# Patient Record
Sex: Female | Born: 1940 | Race: White | Hispanic: No | Marital: Married | Smoking: Former smoker
Health system: Southern US, Community
[De-identification: ages and names within clinical notes are randomized; demographics above are authoritative.]

## PROBLEM LIST (undated history)

## (undated) DIAGNOSIS — E039 Hypothyroidism, unspecified: Secondary | ICD-10-CM

## (undated) DIAGNOSIS — K2270 Barrett's esophagus without dysplasia: Secondary | ICD-10-CM

## (undated) DIAGNOSIS — I499 Cardiac arrhythmia, unspecified: Secondary | ICD-10-CM

## (undated) DIAGNOSIS — T8859XA Other complications of anesthesia, initial encounter: Secondary | ICD-10-CM

## (undated) DIAGNOSIS — I10 Essential (primary) hypertension: Secondary | ICD-10-CM

## (undated) DIAGNOSIS — R112 Nausea with vomiting, unspecified: Secondary | ICD-10-CM

## (undated) DIAGNOSIS — K219 Gastro-esophageal reflux disease without esophagitis: Secondary | ICD-10-CM

## (undated) DIAGNOSIS — C801 Malignant (primary) neoplasm, unspecified: Secondary | ICD-10-CM

## (undated) DIAGNOSIS — Z9889 Other specified postprocedural states: Secondary | ICD-10-CM

## (undated) DIAGNOSIS — F419 Anxiety disorder, unspecified: Secondary | ICD-10-CM

## (undated) DIAGNOSIS — R42 Dizziness and giddiness: Secondary | ICD-10-CM

## (undated) DIAGNOSIS — I493 Ventricular premature depolarization: Secondary | ICD-10-CM

## (undated) DIAGNOSIS — T4145XA Adverse effect of unspecified anesthetic, initial encounter: Secondary | ICD-10-CM

## (undated) HISTORY — PX: EYE SURGERY: SHX253

## (undated) HISTORY — PX: TONSILLECTOMY: SUR1361

## (undated) HISTORY — PX: MOUTH SURGERY: SHX715

## (undated) HISTORY — DX: Ventricular premature depolarization: I49.3

## (undated) HISTORY — PX: CHOLECYSTECTOMY: SHX55

---

## 2015-06-15 DIAGNOSIS — H9190 Unspecified hearing loss, unspecified ear: Secondary | ICD-10-CM

## 2015-06-15 DIAGNOSIS — Z79899 Other long term (current) drug therapy: Secondary | ICD-10-CM | POA: Insufficient documentation

## 2015-06-15 DIAGNOSIS — I10 Essential (primary) hypertension: Secondary | ICD-10-CM

## 2015-06-15 DIAGNOSIS — E782 Mixed hyperlipidemia: Secondary | ICD-10-CM

## 2015-06-15 DIAGNOSIS — K219 Gastro-esophageal reflux disease without esophagitis: Secondary | ICD-10-CM

## 2015-06-15 DIAGNOSIS — N183 Chronic kidney disease, stage 3 unspecified: Secondary | ICD-10-CM

## 2015-06-15 DIAGNOSIS — E559 Vitamin D deficiency, unspecified: Secondary | ICD-10-CM

## 2015-06-15 DIAGNOSIS — I493 Ventricular premature depolarization: Secondary | ICD-10-CM | POA: Insufficient documentation

## 2015-06-15 DIAGNOSIS — E785 Hyperlipidemia, unspecified: Secondary | ICD-10-CM | POA: Insufficient documentation

## 2015-06-15 DIAGNOSIS — R7303 Prediabetes: Secondary | ICD-10-CM

## 2015-06-15 DIAGNOSIS — M8580 Other specified disorders of bone density and structure, unspecified site: Secondary | ICD-10-CM

## 2015-06-15 DIAGNOSIS — N1831 Chronic kidney disease, stage 3a: Secondary | ICD-10-CM

## 2015-06-15 HISTORY — DX: Mixed hyperlipidemia: E78.2

## 2015-06-15 HISTORY — DX: Gastro-esophageal reflux disease without esophagitis: K21.9

## 2015-06-15 HISTORY — DX: Prediabetes: R73.03

## 2015-06-15 HISTORY — DX: Vitamin D deficiency, unspecified: E55.9

## 2015-06-15 HISTORY — DX: Essential (primary) hypertension: I10

## 2015-06-15 HISTORY — DX: Unspecified hearing loss, unspecified ear: H91.90

## 2015-06-15 HISTORY — DX: Chronic kidney disease, stage 3a: N18.31

## 2015-06-15 HISTORY — DX: Hyperlipidemia, unspecified: E78.5

## 2015-06-15 HISTORY — DX: Chronic kidney disease, stage 3 unspecified: N18.30

## 2015-06-15 HISTORY — DX: Other specified disorders of bone density and structure, unspecified site: M85.80

## 2015-06-15 HISTORY — DX: Other long term (current) drug therapy: Z79.899

## 2015-07-14 DIAGNOSIS — F411 Generalized anxiety disorder: Secondary | ICD-10-CM | POA: Insufficient documentation

## 2015-07-14 DIAGNOSIS — E039 Hypothyroidism, unspecified: Secondary | ICD-10-CM | POA: Insufficient documentation

## 2015-07-14 DIAGNOSIS — F419 Anxiety disorder, unspecified: Secondary | ICD-10-CM

## 2015-07-14 DIAGNOSIS — E669 Obesity, unspecified: Secondary | ICD-10-CM

## 2015-07-14 HISTORY — DX: Hypothyroidism, unspecified: E03.9

## 2015-07-14 HISTORY — DX: Obesity, unspecified: E66.9

## 2015-07-14 HISTORY — DX: Anxiety disorder, unspecified: F41.9

## 2016-12-05 ENCOUNTER — Encounter: Payer: Self-pay | Admitting: Cardiology

## 2016-12-05 ENCOUNTER — Ambulatory Visit (INDEPENDENT_AMBULATORY_CARE_PROVIDER_SITE_OTHER): Payer: Medicare Other | Admitting: Cardiology

## 2016-12-05 VITALS — BP 144/82 | HR 69 | Ht 64.0 in | Wt 169.0 lb

## 2016-12-05 DIAGNOSIS — R011 Cardiac murmur, unspecified: Secondary | ICD-10-CM | POA: Diagnosis not present

## 2016-12-05 DIAGNOSIS — E782 Mixed hyperlipidemia: Secondary | ICD-10-CM

## 2016-12-05 DIAGNOSIS — I493 Ventricular premature depolarization: Secondary | ICD-10-CM

## 2016-12-05 DIAGNOSIS — I10 Essential (primary) hypertension: Secondary | ICD-10-CM

## 2016-12-05 DIAGNOSIS — R079 Chest pain, unspecified: Secondary | ICD-10-CM | POA: Diagnosis not present

## 2016-12-05 DIAGNOSIS — E669 Obesity, unspecified: Secondary | ICD-10-CM | POA: Diagnosis not present

## 2016-12-05 DIAGNOSIS — R002 Palpitations: Secondary | ICD-10-CM | POA: Diagnosis not present

## 2016-12-05 NOTE — Progress Notes (Signed)
Cardiology Office Note:    Date:  12/05/2016   ID:  Claire Carlson, DOB [birth year], MRN [id number]  PCP:  Raina Mina., MD  Cardiologist:  Jenean Lindau, MD   Referring MD: Raina Mina., MD    ASSESSMENT:    1. Palpitations   2. Essential hypertension   3. PVC (premature ventricular contraction)   4. Mixed hyperlipidemia   5. Obesity (BMI 30-39.9)   6. Chest pain, unspecified type    PLAN:    In order of problems listed above:  1. I reassured the patient about my findings. Her chest pain is atypical for coronary etiology however in view of multiple risk factors she will undergo exercise stress Cardiolite testing. 2. Echocardiogram will be done to assess murmur heard on auscultation. 3. She'll undergo Holter monitoring for 48-hour period. Further recommendations will be made as to the findings of the aforementioned test. 4. She knows to go to the nearest emergency room for any significant concerns. She will be seen in follow-up appointment in 3 months or earlier if she has any concerns.   Medication Adjustments/Labs and Tests Ordered: Current medicines are reviewed at length with the patient today.  Concerns regarding medicines are outlined above.  Orders Placed This Encounter  Procedures  . Holter monitor - 48 hour  . Myocardial Perfusion Imaging  . ECHOCARDIOGRAM COMPLETE   No orders of the defined types were placed in this encounter.    History of Present Illness:    Claire Carlson is a 76 y.o. female who is being seen today for the evaluation of palpitations and chest pain at the request of Raina Mina., MD. Patient is a pleasant 76 year old female. She has past history of essential hypertension and dyslipidemia. Patient mentions that she's been having palpitations for the past several years but in the past few weeks these have increased.They occur very sporadically and have concerned her. She denies any chest pain orthopnea or PND. Occasionally she  feels tightness in the chest not related to exertion. At the time of my evaluation she is alert awake oriented and in no distress.She tells me that she does not regular with exercise but she is worked up to 3 miles on a regular basis in the recent past.  Past Medical History:  Diagnosis Date  . PVC (premature ventricular contraction)     History reviewed. No pertinent surgical history.  Current Medications: Current Meds  Medication Sig  . ALPRAZolam (XANAX) 0.5 MG tablet Take 0.5 mg by mouth daily as needed.  Marland Kitchen atenolol (TENORMIN) 25 MG tablet Take 12.5 mg by mouth daily.  . Cholecalciferol (VITAMIN D-1000 MAX ST) 1000 units tablet Take 1,000 Units by mouth daily.  . fluticasone (FLONASE) 50 MCG/ACT nasal spray Place 1 spray into both nostrils daily.  Marland Kitchen levothyroxine (SYNTHROID, LEVOTHROID) 88 MCG tablet Take 88 mcg by mouth daily.  Marland Kitchen losartan-hydrochlorothiazide (HYZAAR) 100-12.5 MG tablet Take 1 tablet by mouth daily.  . Meclizine HCl 25 MG CHEW Chew 25 mg by mouth daily as needed.  Marland Kitchen omeprazole (PRILOSEC) 20 MG capsule Take 20 mg by mouth daily.  . zoledronic acid (RECLAST) 5 MG/100ML SOLN injection Inject 5 mg into the vein.     Allergies:   Penicillin g   Social History   Social History  . Marital status: Married    Spouse name: N/A  . Number of children: N/A  . Years of education: N/A   Social History Main Topics  . Smoking status: Former Smoker  Quit date: 69  . Smokeless tobacco: Never Used  . Alcohol use No  . Drug use: No  . Sexual activity: Not Asked   Other Topics Concern  . None   Social History Narrative  . None     Family History: The patient's family history includes Heart attack in her father.  ROS:   Please see the history of present illness.    All other systems reviewed and are negative.  EKGs/Labs/Other Studies Reviewed:    The following studies were reviewed today: I reviewed records from referring physician and also the EKG and  discussed this with the patient at length. The EKG report was available.   Recent Labs: No results found for requested labs within last 8760 hours.  Recent Lipid Panel No results found for: CHOL, TRIG, HDL, CHOLHDL, VLDL, LDLCALC, LDLDIRECT  Physical Exam:    VS:  BP (!) 144/82   Pulse 69   Ht 5\' 4"  (1.626 m)   Wt 169 lb (76.7 kg)   SpO2 98%   BMI 29.01 kg/m     Wt Readings from Last 3 Encounters:  12/05/16 169 lb (76.7 kg)     GEN: Patient is in no acute distress HEENT: Normal NECK: No JVD; No carotid bruits LYMPHATICS: No lymphadenopathy CARDIAC: S1 S2 regular, 2/6 systolic murmur at the apex. RESPIRATORY:  Clear to auscultation without rales, wheezing or rhonchi  ABDOMEN: Soft, non-tender, non-distended MUSCULOSKELETAL:  No edema; No deformity  SKIN: Warm and dry NEUROLOGIC:  Alert and oriented x 3 PSYCHIATRIC:  Normal affect    Signed, Jenean Lindau, MD  12/05/2016 12:00 PM    [place]

## 2016-12-05 NOTE — Patient Instructions (Signed)
Medication Instructions:   Your physician recommends that you continue on your current medications as directed. Please refer to the Current Medication list given to you today.  Labwork:  NONE  Testing/Procedures:  Your physician has recommended that you wear a holter monitor. Holter monitors are medical devices that record the heart's electrical activity. Doctors most often use these monitors to diagnose arrhythmias. Arrhythmias are problems with the speed or rhythm of the heartbeat. The monitor is a small, portable device. You can wear one while you do your normal daily activities. This is usually used to diagnose what is causing palpitations/syncope (passing out).  Your physician has requested that you have an echocardiogram. Echocardiography is a painless test that uses sound waves to create images of your heart. It provides your doctor with information about the size and shape of your heart and how well your heart's chambers and valves are working. This procedure takes approximately one hour. There are no restrictions for this procedure.  Your physician has requested that you have en exercise stress myoview. For further information please visit [email or web address]. Please follow instruction sheet, as given.  These test will be done at main office [address] [place], [place], [place] [address].   Follow-Up:  Your physician recommends that you schedule a follow-up appointment in: 3 months with Dr. Geraldo Pitter.    Any Other Special Instructions Will Be Listed Below (If Applicable).     If you need a refill on your cardiac medications before your next appointment, please call your pharmacy.

## 2016-12-11 ENCOUNTER — Telehealth (HOSPITAL_COMMUNITY): Payer: Self-pay | Admitting: *Deleted

## 2016-12-11 ENCOUNTER — Ambulatory Visit: Payer: Medicare Other

## 2016-12-11 DIAGNOSIS — R002 Palpitations: Secondary | ICD-10-CM

## 2016-12-11 NOTE — Telephone Encounter (Signed)
Left message on voicemail per DPR in reference to upcoming appointment scheduled on 12/14/16 with detailed instructions given per Myocardial Perfusion Study Information Sheet for the test. LM to arrive 15 minutes early, and that it is imperative to arrive on time for appointment to keep from having the test rescheduled. If you need to cancel or reschedule your appointment, please call the office within 24 hours of your appointment. Failure to do so may result in a cancellation of your appointment, and a $50 no show fee. Phone number given for call back for any questions. Kirstie Peri

## 2016-12-14 ENCOUNTER — Ambulatory Visit (HOSPITAL_BASED_OUTPATIENT_CLINIC_OR_DEPARTMENT_OTHER): Payer: Medicare Other

## 2016-12-14 ENCOUNTER — Ambulatory Visit (HOSPITAL_COMMUNITY): Payer: Medicare Other | Attending: Cardiology

## 2016-12-14 ENCOUNTER — Other Ambulatory Visit: Payer: Self-pay

## 2016-12-14 DIAGNOSIS — I119 Hypertensive heart disease without heart failure: Secondary | ICD-10-CM | POA: Diagnosis not present

## 2016-12-14 DIAGNOSIS — R002 Palpitations: Secondary | ICD-10-CM | POA: Diagnosis not present

## 2016-12-14 DIAGNOSIS — I272 Pulmonary hypertension, unspecified: Secondary | ICD-10-CM | POA: Insufficient documentation

## 2016-12-14 LAB — MYOCARDIAL PERFUSION IMAGING
CHL CUP MPHR: 143 {beats}/min
CHL CUP NUCLEAR SSS: 9
CHL CUP RESTING HR STRESS: 68 {beats}/min
CHL RATE OF PERCEIVED EXERTION: 18
CSEPEW: 6.9 METS
Exercise duration (min): 5 min
LHR: 0.28
LV sys vol: 13 mL
LVDIAVOL: 58 mL (ref 46–106)
NUC STRESS TID: 1.03
Peak HR: 144 {beats}/min
Percent HR: 100 %
SDS: 4
SRS: 5

## 2016-12-14 IMAGING — NM NM MISC PROCEDURE
9 series · 54 of 54 positions shown · non-contrast
Comparison: none

[Series 1: wbr_r-proj_st rest · 6.51mm/px · 6 of 64 frames shown]
[frame 6/64]
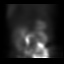
[frame 16/64]
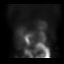
[frame 27/64]
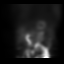
[frame 38/64]
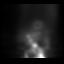
[frame 48/64]
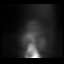
[frame 59/64]
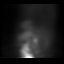

[Series 1: wbr_s-proj_st stress_(id)_sa · 6.5mm · 6.51mm/px · 6 of 64 frames shown (1 of 2)]
[frame 6/64]
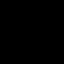
[frame 16/64]
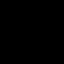
[frame 27/64]
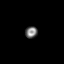
[frame 38/64]
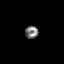
[frame 48/64]
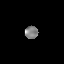
[frame 59/64]
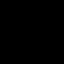

[Series 1: wbr_r-proj_st rest_(id)_sa · 6.5mm · 6.51mm/px · 6 of 64 frames shown]
[frame 6/64]
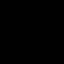
[frame 16/64]
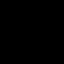
[frame 27/64]
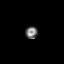
[frame 38/64]
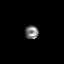
[frame 48/64]
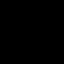
[frame 59/64]
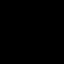

[Series 1: wbr_s-proj_st stress_(id)_sa · 6.5mm · 6.51mm/px · 6 of 512 frames shown (2 of 2)]
[frame 43/512]
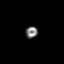
[frame 128/512]
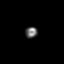
[frame 214/512]
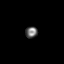
[frame 299/512]
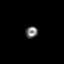
[frame 384/512]
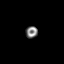
[frame 470/512]
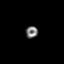

[Series 1: rest · 6.51mm/px · 6 of 64 frames shown]
[frame 6/64]
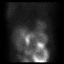
[frame 16/64]
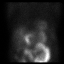
[frame 27/64]
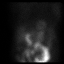
[frame 38/64]
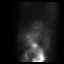
[frame 48/64]
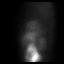
[frame 59/64]
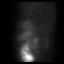

[Series 2: wbr_s-proj_st stress · 6.51mm/px · 6 of 512 frames shown (1 of 2)]
[frame 43/512]
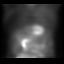
[frame 128/512]
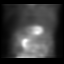
[frame 214/512]
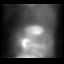
[frame 299/512]
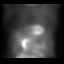
[frame 384/512]
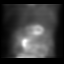
[frame 470/512]
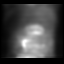

[Series 2: stress · 6.51mm/px · 6 of 64 frames shown (1 of 2)]
[frame 6/64]
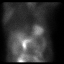
[frame 16/64]
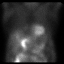
[frame 27/64]
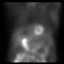
[frame 38/64]
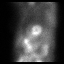
[frame 48/64]
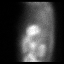
[frame 59/64]
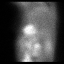

[Series 2: stress · 6.51mm/px · 6 of 485 frames shown (2 of 2)]
[frame 41/485  full-range]
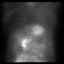
[frame 121/485  full-range]
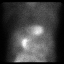
[frame 202/485  full-range]
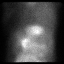
[frame 283/485  full-range]
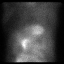
[frame 364/485  full-range]
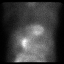
[frame 445/485  full-range]
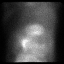

[Series 2: wbr_s-proj_st stress · 6.51mm/px · 6 of 64 frames shown (2 of 2)]
[frame 6/64]
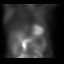
[frame 16/64]
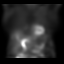
[frame 27/64]
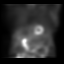
[frame 38/64]
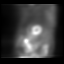
[frame 48/64]
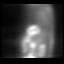
[frame 59/64]
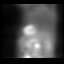

[54 of 54 positions shown; findings below may reference images not displayed]

Canned report from images found in remote index.

Refer to host system for actual result text.

## 2016-12-14 MED ORDER — TECHNETIUM TC 99M TETROFOSMIN IV KIT
10.4000 | PACK | Freq: Once | INTRAVENOUS | Status: AC | PRN
  Administered 2016-12-14: 10.4 via INTRAVENOUS
  Filled 2016-12-14: qty 11

## 2016-12-14 MED ORDER — TECHNETIUM TC 99M TETROFOSMIN IV KIT
32.4000 | PACK | Freq: Once | INTRAVENOUS | Status: AC | PRN
  Administered 2016-12-14: 32.4 via INTRAVENOUS
  Filled 2016-12-14: qty 33

## 2016-12-21 ENCOUNTER — Telehealth: Payer: Self-pay

## 2016-12-21 NOTE — Telephone Encounter (Signed)
Pt informed of normal Holter Monitor via [employer]. Ok per [employer]

## 2017-02-07 DIAGNOSIS — Z171 Estrogen receptor negative status [ER-]: Secondary | ICD-10-CM

## 2017-02-07 DIAGNOSIS — N63 Unspecified lump in unspecified breast: Secondary | ICD-10-CM

## 2017-02-07 DIAGNOSIS — C50112 Malignant neoplasm of central portion of left female breast: Secondary | ICD-10-CM | POA: Insufficient documentation

## 2017-02-07 HISTORY — DX: Malignant neoplasm of central portion of left female breast: C50.112

## 2017-02-07 HISTORY — DX: Unspecified lump in unspecified breast: N63.0

## 2017-02-07 HISTORY — DX: Estrogen receptor negative status (ER-): Z17.1

## 2017-02-24 DIAGNOSIS — C801 Malignant (primary) neoplasm, unspecified: Secondary | ICD-10-CM | POA: Insufficient documentation

## 2017-02-24 HISTORY — DX: Malignant (primary) neoplasm, unspecified: C80.1

## 2017-02-26 ENCOUNTER — Other Ambulatory Visit: Payer: Self-pay | Admitting: Internal Medicine

## 2017-02-26 DIAGNOSIS — N6489 Other specified disorders of breast: Secondary | ICD-10-CM

## 2017-03-06 ENCOUNTER — Ambulatory Visit: Payer: Medicare Other | Admitting: Cardiology

## 2017-03-09 ENCOUNTER — Ambulatory Visit
Admission: RE | Admit: 2017-03-09 | Discharge: 2017-03-09 | Disposition: A | Discharge status: Home / Self Care | Payer: 59 | Source: Ambulatory Visit | Attending: Internal Medicine | Admitting: Internal Medicine

## 2017-03-09 DIAGNOSIS — N6489 Other specified disorders of breast: Secondary | ICD-10-CM

## 2017-03-14 ENCOUNTER — Ambulatory Visit (INDEPENDENT_AMBULATORY_CARE_PROVIDER_SITE_OTHER): Payer: Medicare Other | Admitting: Cardiology

## 2017-03-14 ENCOUNTER — Encounter: Payer: Self-pay | Admitting: Cardiology

## 2017-03-14 VITALS — BP 134/74 | HR 56 | Ht 65.0 in | Wt 170.4 lb

## 2017-03-14 DIAGNOSIS — E669 Obesity, unspecified: Secondary | ICD-10-CM | POA: Diagnosis not present

## 2017-03-14 DIAGNOSIS — I493 Ventricular premature depolarization: Secondary | ICD-10-CM | POA: Diagnosis not present

## 2017-03-14 DIAGNOSIS — I10 Essential (primary) hypertension: Secondary | ICD-10-CM | POA: Diagnosis not present

## 2017-03-14 DIAGNOSIS — E782 Mixed hyperlipidemia: Secondary | ICD-10-CM

## 2017-03-14 DIAGNOSIS — I4729 Other ventricular tachycardia: Secondary | ICD-10-CM

## 2017-03-14 DIAGNOSIS — I472 Ventricular tachycardia: Secondary | ICD-10-CM | POA: Insufficient documentation

## 2017-03-14 HISTORY — DX: Other ventricular tachycardia: I47.29

## 2017-03-14 HISTORY — DX: Ventricular tachycardia: I47.2

## 2017-03-14 NOTE — Patient Instructions (Signed)
Medication Instructions:  Your physician recommends that you continue on your current medications as directed. Please refer to the Current Medication list given to you today.  Labwork: None  Testing/Procedures: None  Follow-Up: Your physician recommends that you schedule a follow-up appointment in: 6 months  Any Other Special Instructions Will Be Listed Below (If Applicable).     If you need a refill on your cardiac medications before your next appointment, please call your pharmacy.   [facility]  Aizlyn Schifano A, RN, BSN  

## 2017-03-14 NOTE — Progress Notes (Signed)
Cardiology Office Note:    Date:  03/14/2017   ID:  Claire Carlson, DOB 1940/09/03, MRN [id number]  PCP:  Raina Mina., MD  Cardiologist:  Jenean Lindau, MD   Referring MD: Raina Mina., MD    ASSESSMENT:    No diagnosis found. PLAN:    In order of problems listed above:  1. I discussed stress test report with the patient.  Her stress test, echocardiogram and Holter monitor reports are unremarkable.  She has had one ventricular triplet.  Her left ventricular systolic function is fine.  Patient is asymptomatic and her hemodynamics are optimally controlled.  In view of this she has not at high risk for coronary events during the aforementioned surgery.  Meticulous hemodynamic monitoring will further reduce the risk of coronary events. 2. Blood pressure is stable and lipids are followed by primary care physician. 3. Patient will be seen in follow-up appointment in 6 months or earlier if the patient has any concerns    Medication Adjustments/Labs and Tests Ordered: Current medicines are reviewed at length with the patient today.  Concerns regarding medicines are outlined above.  No orders of the defined types were placed in this encounter.  No orders of the defined types were placed in this encounter.    Chief Complaint  Patient presents with  . Follow-up     History of Present Illness:    Claire Carlson is a 76 y.o. female.  Patient has past medical history of essential hypertension and dyslipidemia.  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  She is due to see a Psychologist, sport and exercise in the next few days.  She denies any problems at this time and takes care of activities of daily living.  No chest pain orthopnea or PND.  At the time of my evaluation, the patient is alert awake oriented and in no distress.  She is an active lady and takes care of activities of daily living with no symptoms.  No chest pain.  She  denies any shortness of breath on exertion.  She is undergone multiple testing for cardiovascular evaluation from operative standpoint.  Past Medical History:  Diagnosis Date  . PVC (premature ventricular contraction)     No past surgical history on file.  Current Medications: Current Meds  Medication Sig  . ALPRAZolam (XANAX) 0.5 MG tablet Take 0.5 mg by mouth daily as needed.  Marland Kitchen atenolol (TENORMIN) 25 MG tablet Take 12.5 mg by mouth daily.  . Cholecalciferol (VITAMIN D-1000 MAX ST) 1000 units tablet Take 1,000 Units by mouth daily.  . fluticasone (FLONASE) 50 MCG/ACT nasal spray Place 1 spray into both nostrils daily.  Marland Kitchen levothyroxine (SYNTHROID, LEVOTHROID) 88 MCG tablet Take 88 mcg by mouth daily.  Marland Kitchen losartan-hydrochlorothiazide (HYZAAR) 100-12.5 MG tablet Take 1 tablet by mouth daily.  . Meclizine HCl 25 MG CHEW Chew 25 mg by mouth daily as needed.  Marland Kitchen omeprazole (PRILOSEC) 20 MG capsule Take 20 mg by mouth daily.  . zoledronic acid (RECLAST) 5 MG/100ML SOLN injection Inject 5 mg into the vein.     Allergies:   Penicillin g   Social History   Socioeconomic History  . Marital status: Married    Spouse name: None  . Number of children: None  . Years of education: None  . Highest education level: None  Social Needs  . Financial resource strain: None  . Food insecurity - worry: None  .  Food insecurity - inability: None  . Transportation needs - medical: None  . Transportation needs - non-medical: None  Occupational History  . None  Tobacco Use  . Smoking status: Former Smoker    Last attempt to quit: 1985    Years since quitting: 33.9  . Smokeless tobacco: Never Used  Substance and Sexual Activity  . Alcohol use: No  . Drug use: No  . Sexual activity: None  Other Topics Concern  . None  Social History Narrative  . None     Family History: The patient's family history includes Heart attack in her father.  ROS:   Please see the history of present illness.      All other systems reviewed and are negative.  EKGs/Labs/Other Studies Reviewed:    The following studies were reviewed today: I discussed after reviewing Holter monitor, stress test and echocardiogram with the patient at extensive length and questions were answered to her satisfaction.   Recent Labs: No results found for requested labs within last 8760 hours.  Recent Lipid Panel No results found for: CHOL, TRIG, HDL, CHOLHDL, VLDL, LDLCALC, LDLDIRECT  Physical Exam:    VS:  BP 134/74   Pulse (!) 56   Ht 5\' 5"  (1.651 m)   Wt 170 lb 6.4 oz (77.3 kg)   SpO2 99%   BMI 28.36 kg/m     Wt Readings from Last 3 Encounters:  03/14/17 170 lb 6.4 oz (77.3 kg)  12/14/16 169 lb (76.7 kg)  12/05/16 169 lb (76.7 kg)     GEN: Patient is in no acute distress HEENT: Normal NECK: No JVD; No carotid bruits LYMPHATICS: No lymphadenopathy CARDIAC: Hear sounds regular, 2/6 systolic murmur at the apex. RESPIRATORY:  Clear to auscultation without rales, wheezing or rhonchi  ABDOMEN: Soft, non-tender, non-distended MUSCULOSKELETAL:  No edema; No deformity  SKIN: Warm and dry NEUROLOGIC:  Alert and oriented x 3 PSYCHIATRIC:  Normal affect   Signed, Jenean Lindau, MD  03/14/2017 10:56 AM    [place]

## 2017-03-22 ENCOUNTER — Other Ambulatory Visit: Payer: Self-pay | Admitting: General Surgery

## 2017-03-22 ENCOUNTER — Ambulatory Visit: Payer: Self-pay | Admitting: General Surgery

## 2017-03-22 DIAGNOSIS — C50911 Malignant neoplasm of unspecified site of right female breast: Secondary | ICD-10-CM

## 2017-03-23 ENCOUNTER — Other Ambulatory Visit: Payer: Self-pay | Admitting: General Surgery

## 2017-03-23 DIAGNOSIS — C50911 Malignant neoplasm of unspecified site of right female breast: Secondary | ICD-10-CM

## 2017-03-30 ENCOUNTER — Encounter (HOSPITAL_BASED_OUTPATIENT_CLINIC_OR_DEPARTMENT_OTHER): Payer: Self-pay | Admitting: *Deleted

## 2017-03-30 ENCOUNTER — Other Ambulatory Visit: Payer: Self-pay

## 2017-04-02 ENCOUNTER — Encounter (HOSPITAL_BASED_OUTPATIENT_CLINIC_OR_DEPARTMENT_OTHER)
Admission: RE | Admit: 2017-04-02 | Discharge: 2017-04-02 | Disposition: A | Discharge status: Home / Self Care | Payer: Medicare Other | Source: Ambulatory Visit | Attending: General Surgery | Admitting: General Surgery

## 2017-04-02 DIAGNOSIS — I10 Essential (primary) hypertension: Secondary | ICD-10-CM | POA: Insufficient documentation

## 2017-04-02 DIAGNOSIS — Z01810 Encounter for preprocedural cardiovascular examination: Secondary | ICD-10-CM | POA: Diagnosis present

## 2017-04-02 DIAGNOSIS — I491 Atrial premature depolarization: Secondary | ICD-10-CM | POA: Insufficient documentation

## 2017-04-02 DIAGNOSIS — I493 Ventricular premature depolarization: Secondary | ICD-10-CM | POA: Insufficient documentation

## 2017-04-02 DIAGNOSIS — Z01812 Encounter for preprocedural laboratory examination: Secondary | ICD-10-CM | POA: Insufficient documentation

## 2017-04-02 LAB — BASIC METABOLIC PANEL
Anion gap: 7 (ref 5–15)
BUN: 17 mg/dL (ref 6–20)
CHLORIDE: 100 mmol/L — AB (ref 101–111)
CO2: 28 mmol/L (ref 22–32)
Calcium: 9.7 mg/dL (ref 8.9–10.3)
Creatinine, Ser: 1.15 mg/dL — ABNORMAL HIGH (ref 0.44–1.00)
GFR calc non Af Amer: 45 mL/min — ABNORMAL LOW (ref >60)
GFR, EST AFRICAN AMERICAN: 52 mL/min — AB (ref >60)
GLUCOSE: 89 mg/dL (ref 65–99)
POTASSIUM: 5.1 mmol/L (ref 3.5–5.1)
Sodium: 135 mmol/L (ref 135–145)

## 2017-04-02 NOTE — Progress Notes (Addendum)
Ensure pre surgery drink given with instructions to complete by [place], surgical scrub given with instructions, pt verbalized understanding.  EKG reviewed by Dr. Eligha Bridegroom, will proceed with surgery as scheduled.

## 2017-04-05 ENCOUNTER — Ambulatory Visit
Admission: RE | Admit: 2017-04-05 | Discharge: 2017-04-05 | Disposition: A | Discharge status: Home / Self Care | Payer: Medicare Other | Source: Ambulatory Visit | Attending: General Surgery | Admitting: General Surgery

## 2017-04-05 DIAGNOSIS — C50911 Malignant neoplasm of unspecified site of right female breast: Secondary | ICD-10-CM

## 2017-04-05 NOTE — Anesthesia Preprocedure Evaluation (Addendum)
Anesthesia Evaluation    History of Anesthesia Complications (+) PONV and history of anesthetic complications  Airway Mallampati: II  TM Distance: >3 FB Neck ROM: Full    Dental no notable dental hx.    Pulmonary former smoker,    Pulmonary exam normal breath sounds clear to auscultation       Cardiovascular hypertension, Pt. on medications and Pt. on home beta blockers Normal cardiovascular exam Rhythm:Regular Rate:Normal     Neuro/Psych    GI/Hepatic GERD  Medicated,  Endo/Other  Hypothyroidism   Renal/GU      Musculoskeletal   Abdominal   Peds  Hematology   Anesthesia Other Findings Stress  9/18           Nuclear stress EF: 77%.  There was no ST segment deviation noted during stress.  Blood pressure demonstrated a hypertensive response to exercise.  The study is normal.  This is a low risk study.  The left ventricular ejection fraction is hyperdynamic (>65%).   Normal exercise nuclear stress test with no evidence    Echo 9/18 - Normal LV size with mild LV hypertrophy. EF 60-65%. Normal   diastolic function. Normal RV size and systolic function.   Borderline pulmonary hypertension. No significant valvular   abnormalities.  Reproductive/Obstetrics                            Anesthesia Physical Anesthesia Plan  ASA: II  Anesthesia Plan: General   Post-op Pain Management: GA combined w/ Regional for post-op pain   Induction: Intravenous  PONV Risk Score and Plan: 3 and Ondansetron, Dexamethasone, Treatment may vary due to age or medical condition and Propofol infusion  Airway Management Planned: Oral ETT and LMA  Additional Equipment:   Intra-op Plan:   Post-operative Plan: Extubation in OR  Informed Consent:   Plan Discussed with: CRNA and Anesthesiologist  Anesthesia Plan Comments: (  )        Anesthesia Quick Evaluation

## 2017-04-05 NOTE — H&P (Signed)
History of Present Illness Marland Kitchen T. Deshauna Cayson MD; 03/22/2017 11:04 AM) The patient is a 77 year old female who presents with breast cancer. She is a post menopausal female referred by Dr. 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 She recently presented for a screening mamogram revealing a new area of microcalcifications in the right breast as well as potential architectural distortion in the same quadrant. Subsequent imaging included diagnostic mamogram showing an area of pleomorphic calcifications measuring approximately 1 cm and persistent distortion. Ultrasound there also showed a small adjacent mass. In [place] she underwent stereotactic biopsy of the area of calcifications revealing high-grade ductal carcinoma in situ and ultrasound-guided biopsy of the mass revealing fibrocystic change. Neither of these areas correlated to the area of distortion and she underwent a tomosynthesis guided biopsy in [place] which revealed invasive carcinoma.. She is seen now in the office for initial treatment planning. She has experienced no breast symptoms prior to her recent screening mammogram, specifically lump or pain or nipple discharge. She does not have a personal history of any previous breast problems. I reviewed the imaging personally with radiology. Currently there is an X-shaped clip in the area of distortion and invasive carcinoma upper outer quadrant anterior depth and a ribbon-shaped clip upper outer quadrant middle depth in the area of DCIS. A coil-shaped marker is present in the area fibrocystic change. Overall the 2 areas of malignancy and clip encompass 4 cm.  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 Cataract Surgery  Bilateral. Cesarean Section - 1  Colon Polyp Removal - Colonoscopy  Gallbladder Surgery - Laparoscopic  Oral Surgery  Tonsillectomy   Diagnostic Studies History Alean Rinne, RMA; 03/22/2017 10:00 AM) Colonoscopy  1-5 years ago Mammogram  within last year Pap Smear  1-5 years ago  Allergies Alean Rinne, RMA; 03/22/2017 10:02 AM) Penicillins  Biaxin *MACROLIDES*  Allergies Reconciled   Medication History Alean Rinne, RMA; 03/22/2017 10:03 AM) Atenolol (25MG Tablet, Oral) Active. Losartan Potassium-HCTZ (100-12.5MG Tablet, Oral) Active. Synthroid (88MCG Tablet, Oral) Active. Omeprazole (20MG Capsule DR, Oral) Active. Medications Reconciled  Social History Alean Rinne, [place]; 03/22/2017 10:00 AM) Caffeine use  Tea. No alcohol use  No drug use  Tobacco use  Former smoker.  Family History Alean Rinne, [place]; 03/22/2017 10:00 AM) Cancer  Mother. Heart Disease  Father. Heart disease in female family member before age 16  Hypertension  Mother, Sister. Thyroid problems  Mother, Sister.  Pregnancy / Birth History Alean Rinne, [place]; 03/22/2017 10:00 AM) Age at menarche  57 years. Age of menopause  59-50 Contraceptive History  Oral contraceptives. Gravida  2 Maternal age  35-25 Para  61  Other Problems Alean Rinne, [place]; 03/22/2017 10:00 AM) Cholelithiasis  Gastroesophageal Reflux Disease  Hepatitis  High blood pressure  Thyroid Disease     Review of Systems Alean Rinne RMA; 03/22/2017 10:00 AM) General Not Present- Appetite Loss, Chills, Fatigue, Fever, Night Sweats, Weight Gain and Weight Loss. Skin Not Present- Change in Wart/Mole, Dryness, Hives, Jaundice, New Lesions, Non-Healing Wounds, Rash and Ulcer. HEENT Present- Seasonal Allergies and Wears glasses/contact lenses. Not Present-  Earache, Hearing Loss, Hoarseness, Nose Bleed, Oral Ulcers, Ringing  in the Ears, Sinus Pain, Sore Throat, Visual Disturbances and Yellow Eyes. Respiratory Not Present- Bloody sputum, Chronic Cough, Difficulty Breathing, Snoring and Wheezing. Breast Present- Breast Pain and Skin Changes. Not Present- Breast Mass and Nipple Discharge. Cardiovascular Present- Palpitations. Not Present- Chest Pain, Difficulty Breathing Lying Down, Leg Cramps, Rapid Heart Rate, Shortness of Breath and Swelling of Extremities. Female Genitourinary Not Present- Frequency, Nocturia, Painful Urination, Pelvic Pain and Urgency. Musculoskeletal Not Present- Back Pain, Joint Pain, Joint Stiffness, Muscle Pain, Muscle Weakness and Swelling of Extremities. Neurological Not Present- Decreased Memory, Fainting, Headaches, Numbness, Seizures, Tingling, Tremor, Trouble walking and Weakness. Psychiatric Not Present- Anxiety, Bipolar, Change in Sleep Pattern, Depression, Fearful and Frequent crying. Endocrine Not Present- Cold Intolerance, Excessive Hunger, Hair Changes, Heat Intolerance, Hot flashes and New Diabetes. Hematology Not Present- Blood Thinners, Easy Bruising, Excessive bleeding, Gland problems, HIV and Persistent Infections.  Vitals Mardene Celeste King RMA; 03/22/2017 10:02 AM) 03/22/2017 10:01 AM Weight: 170 lb Height: 65in Body Surface Area: 1.85 m Body Mass Index: 28.29 kg/m  Temp.: 98.79F  Pulse: 98 (Regular)  BP: 150/82 (Sitting, Left Arm, Standard)       Physical Exam Marland Kitchen T. Kenidi Elenbaas MD; 03/22/2017 11:05 AM) The physical exam findings are as follows: Note:General: Alert, well-developed and well nourished older Caucasian female, in no distress Skin: Warm and dry without rash or infection. HEENT: No palpable masses or thyromegaly. Sclera nonicteric. Pupils equal round and reactive. Lymph nodes: No cervical, supraclavicular, nodes palpable. Breasts: There is some thickening or mass in the  upper outer right breast post biopsy. No palpable abnormalities otherwise in either breast. No palpable axillary adenopathy. Lungs: Breath sounds clear and equal. No wheezing or increased work of breathing. Cardiovascular: Regular rate and rhythm without murmer. Abdomen: Nondistended. Soft and nontender. No masses palpable. No organomegaly. No palpable hernias. Extremities: No edema or joint swelling or deformity. No chronic venous stasis changes. Neurologic: Alert and fully oriented. Gait normal. No focal weakness. Psychiatric: Normal mood and affect. Thought content appropriate with normal judgement and insight    Assessment & Plan Marland Kitchen T. Chelsey Kimberley MD; 03/22/2017 11:09 AM) MALIGNANT NEOPLASM OF RIGHT BREAST, STAGE 1, ESTROGEN RECEPTOR POSITIVE (C50.911) Impression: 77 year old female with a new diagnosis of cancer of the right breast, upper outer quadrant. Clinical stage Ia, ER positive, PR negative, HER-2 negative. 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 Both areas encompass a total of 4 cm in greatest diameter measuring about 1 cm each. I discussed with the patient and family members present today initial surgical treatment options. We discussed options of breast conservation with lumpectomy or total mastectomy and sentinal lymph node biopsy/dissection. She would not be an absolutely ideal candidate for breast conservation with 2 separate areas but these are in the same quadrant and nearby and could be encompassed with a single lumpectomy. After discussion they have elected to proceed with this conservation with radioactive seed localized lumpectomy and axillary sentinel lymph node biopsy. We discussed the indications and nature of the procedure, and expected recovery, in detail. Surgical risks including anesthetic complications, cardiorespiratory complications, bleeding, infection, wound healing complications,  blood clots, lymphedema, local and distant recurrence and possible need for further surgery based on the final pathology was discussed and understood. Chemotherapy, hormonal therapy and radiation therapy have been discussed. They have been provided with literature regarding the treatment of breast cancer. All questions were answered. She just saw her cardiologist and we  have verbal clearance and we will confirm this. They understand and agree to proceed and we will go ahead with scheduling. Current Plans Referred to Oncology, for evaluation and follow up (Oncology). Routine. Referred to Radiation Oncology, for evaluation and follow up (Radiation Oncology). Routine. Pt Education - [facility] Breast Cancer Information Given - [employer] "Breast Journey" Package Radioactive seed localized right breast lumpectomy 2 and right axillary sentinel lymph node biopsy under general anesthesia as an outpatient

## 2017-04-06 ENCOUNTER — Ambulatory Visit (HOSPITAL_BASED_OUTPATIENT_CLINIC_OR_DEPARTMENT_OTHER): Payer: Medicare Other | Admitting: Certified Registered"

## 2017-04-06 ENCOUNTER — Encounter (HOSPITAL_BASED_OUTPATIENT_CLINIC_OR_DEPARTMENT_OTHER)
Admission: RE | Disposition: A | Discharge status: Home / Self Care | Payer: Self-pay | Source: Ambulatory Visit | Attending: General Surgery

## 2017-04-06 ENCOUNTER — Ambulatory Visit
Admission: RE | Admit: 2017-04-06 | Discharge: 2017-04-06 | Disposition: A | Discharge status: Home / Self Care | Payer: Medicare Other | Source: Ambulatory Visit | Attending: General Surgery | Admitting: General Surgery

## 2017-04-06 ENCOUNTER — Ambulatory Visit (HOSPITAL_BASED_OUTPATIENT_CLINIC_OR_DEPARTMENT_OTHER)
Admission: RE | Admit: 2017-04-06 | Discharge: 2017-04-06 | Disposition: A | Discharge status: Home / Self Care | Payer: Medicare Other | Source: Ambulatory Visit | Attending: General Surgery | Admitting: General Surgery

## 2017-04-06 ENCOUNTER — Ambulatory Visit (HOSPITAL_COMMUNITY)
Admission: RE | Admit: 2017-04-06 | Discharge: 2017-04-06 | Disposition: A | Discharge status: Home / Self Care | Payer: Medicare Other | Source: Ambulatory Visit | Attending: General Surgery | Admitting: General Surgery

## 2017-04-06 ENCOUNTER — Encounter (HOSPITAL_BASED_OUTPATIENT_CLINIC_OR_DEPARTMENT_OTHER): Payer: Self-pay | Admitting: Certified Registered"

## 2017-04-06 DIAGNOSIS — C50911 Malignant neoplasm of unspecified site of right female breast: Secondary | ICD-10-CM

## 2017-04-06 DIAGNOSIS — Z170 Estrogen receptor positive status [ER+]: Secondary | ICD-10-CM | POA: Insufficient documentation

## 2017-04-06 DIAGNOSIS — Z87891 Personal history of nicotine dependence: Secondary | ICD-10-CM | POA: Insufficient documentation

## 2017-04-06 DIAGNOSIS — K219 Gastro-esophageal reflux disease without esophagitis: Secondary | ICD-10-CM | POA: Diagnosis not present

## 2017-04-06 DIAGNOSIS — Z79899 Other long term (current) drug therapy: Secondary | ICD-10-CM | POA: Insufficient documentation

## 2017-04-06 DIAGNOSIS — E039 Hypothyroidism, unspecified: Secondary | ICD-10-CM | POA: Insufficient documentation

## 2017-04-06 DIAGNOSIS — I10 Essential (primary) hypertension: Secondary | ICD-10-CM | POA: Diagnosis not present

## 2017-04-06 DIAGNOSIS — C50411 Malignant neoplasm of upper-outer quadrant of right female breast: Secondary | ICD-10-CM | POA: Diagnosis present

## 2017-04-06 HISTORY — DX: Other complications of anesthesia, initial encounter: T88.59XA

## 2017-04-06 HISTORY — DX: Barrett's esophagus without dysplasia: K22.70

## 2017-04-06 HISTORY — DX: Nausea with vomiting, unspecified: R11.2

## 2017-04-06 HISTORY — DX: Dizziness and giddiness: R42

## 2017-04-06 HISTORY — PX: BREAST LUMPECTOMY WITH RADIOACTIVE SEED AND SENTINEL LYMPH NODE BIOPSY: SHX6550

## 2017-04-06 HISTORY — DX: Other specified postprocedural states: Z98.890

## 2017-04-06 HISTORY — DX: Malignant (primary) neoplasm, unspecified: C80.1

## 2017-04-06 HISTORY — DX: Cardiac arrhythmia, unspecified: I49.9

## 2017-04-06 HISTORY — DX: Adverse effect of unspecified anesthetic, initial encounter: T41.45XA

## 2017-04-06 HISTORY — DX: Anxiety disorder, unspecified: F41.9

## 2017-04-06 HISTORY — DX: Gastro-esophageal reflux disease without esophagitis: K21.9

## 2017-04-06 HISTORY — DX: Hypothyroidism, unspecified: E03.9

## 2017-04-06 HISTORY — DX: Essential (primary) hypertension: I10

## 2017-04-06 SURGERY — BREAST LUMPECTOMY WITH RADIOACTIVE SEED AND SENTINEL LYMPH NODE BIOPSY
Anesthesia: General | Site: Breast | Laterality: Right

## 2017-04-06 MED ORDER — FENTANYL CITRATE (PF) 100 MCG/2ML IJ SOLN
INTRAMUSCULAR | Status: AC
  Filled 2017-04-06: qty 2

## 2017-04-06 MED ORDER — GABAPENTIN 300 MG PO CAPS
300.0000 mg | ORAL_CAPSULE | ORAL | Status: AC
  Administered 2017-04-06: 300 mg via ORAL

## 2017-04-06 MED ORDER — CELECOXIB 200 MG PO CAPS
ORAL_CAPSULE | ORAL | Status: AC
  Filled 2017-04-06: qty 1

## 2017-04-06 MED ORDER — GABAPENTIN 300 MG PO CAPS
ORAL_CAPSULE | ORAL | Status: AC
  Filled 2017-04-06: qty 1

## 2017-04-06 MED ORDER — FENTANYL CITRATE (PF) 100 MCG/2ML IJ SOLN
25.0000 ug | INTRAMUSCULAR | Status: DC | PRN
Start: 2017-04-06 — End: 2017-04-06
  Administered 2017-04-06: 25 ug via INTRAVENOUS

## 2017-04-06 MED ORDER — LACTATED RINGERS IV SOLN
INTRAVENOUS | Status: DC
  Administered 2017-04-06 (×2): via INTRAVENOUS

## 2017-04-06 MED ORDER — CIPROFLOXACIN IN D5W 400 MG/200ML IV SOLN
INTRAVENOUS | Status: AC
  Filled 2017-04-06: qty 200

## 2017-04-06 MED ORDER — BUPIVACAINE-EPINEPHRINE (PF) 0.5% -1:200000 IJ SOLN
INTRAMUSCULAR | Status: AC
  Filled 2017-04-06: qty 30

## 2017-04-06 MED ORDER — CHLORHEXIDINE GLUCONATE CLOTH 2 % EX PADS: 6.0000 | MEDICATED_PAD | Freq: Once | CUTANEOUS | Status: DC

## 2017-04-06 MED ORDER — EPHEDRINE SULFATE 50 MG/ML IJ SOLN
INTRAMUSCULAR | Status: DC | PRN
  Administered 2017-04-06 (×2): 10 mg via INTRAVENOUS

## 2017-04-06 MED ORDER — CIPROFLOXACIN IN D5W 400 MG/200ML IV SOLN
400.0000 mg | INTRAVENOUS | Status: AC
  Administered 2017-04-06: 400 mg via INTRAVENOUS

## 2017-04-06 MED ORDER — SODIUM CHLORIDE 0.9 % IJ SOLN
INTRAVENOUS | Status: DC | PRN
  Administered 2017-04-06: 5 mL via SUBCUTANEOUS

## 2017-04-06 MED ORDER — SCOPOLAMINE 1 MG/3DAYS TD PT72: 1.0000 | MEDICATED_PATCH | Freq: Once | TRANSDERMAL | Status: DC | PRN

## 2017-04-06 MED ORDER — TECHNETIUM TC 99M SULFUR COLLOID FILTERED
1.0000 | Freq: Once | INTRAVENOUS | Status: AC | PRN
  Administered 2017-04-06: 1 via INTRADERMAL

## 2017-04-06 MED ORDER — BUPIVACAINE-EPINEPHRINE (PF) 0.5% -1:200000 IJ SOLN
INTRAMUSCULAR | Status: DC | PRN
  Administered 2017-04-06: 10 mL

## 2017-04-06 MED ORDER — ONDANSETRON HCL 4 MG/2ML IJ SOLN
INTRAMUSCULAR | Status: DC | PRN
  Administered 2017-04-06: 4 mg via INTRAVENOUS

## 2017-04-06 MED ORDER — MEPERIDINE HCL 25 MG/ML IJ SOLN: 6.2500 mg | INTRAMUSCULAR | Status: DC | PRN

## 2017-04-06 MED ORDER — TRAMADOL HCL 50 MG PO TABS: 50.0000 mg | ORAL_TABLET | Freq: Four times a day (QID) | ORAL | 1 refills | Status: DC | PRN

## 2017-04-06 MED ORDER — CELECOXIB 200 MG PO CAPS
200.0000 mg | ORAL_CAPSULE | ORAL | Status: AC
  Administered 2017-04-06: 200 mg via ORAL

## 2017-04-06 MED ORDER — FENTANYL CITRATE (PF) 100 MCG/2ML IJ SOLN
50.0000 ug | INTRAMUSCULAR | Status: DC | PRN
  Administered 2017-04-06 (×2): 50 ug via INTRAVENOUS

## 2017-04-06 MED ORDER — METHYLENE BLUE 0.5 % INJ SOLN
INTRAVENOUS | Status: AC
  Filled 2017-04-06: qty 10

## 2017-04-06 MED ORDER — DEXAMETHASONE SODIUM PHOSPHATE 4 MG/ML IJ SOLN
INTRAMUSCULAR | Status: DC | PRN
  Administered 2017-04-06: 10 mg via INTRAVENOUS

## 2017-04-06 MED ORDER — PROPOFOL 10 MG/ML IV BOLUS
INTRAVENOUS | Status: DC | PRN
  Administered 2017-04-06: 100 mg via INTRAVENOUS

## 2017-04-06 MED ORDER — ACETAMINOPHEN 500 MG PO TABS
1000.0000 mg | ORAL_TABLET | ORAL | Status: AC
  Administered 2017-04-06: 1000 mg via ORAL

## 2017-04-06 MED ORDER — LIDOCAINE HCL (CARDIAC) 20 MG/ML IV SOLN
INTRAVENOUS | Status: DC | PRN
  Administered 2017-04-06: 30 mg via INTRAVENOUS

## 2017-04-06 MED ORDER — MIDAZOLAM HCL 2 MG/2ML IJ SOLN
1.0000 mg | INTRAMUSCULAR | Status: DC | PRN
  Administered 2017-04-06: 1 mg via INTRAVENOUS

## 2017-04-06 MED ORDER — ROPIVACAINE HCL 7.5 MG/ML IJ SOLN
INTRAMUSCULAR | Status: DC | PRN
  Administered 2017-04-06: 30 mL via PERINEURAL

## 2017-04-06 MED ORDER — SODIUM CHLORIDE 0.9 % IJ SOLN
INTRAMUSCULAR | Status: AC
  Filled 2017-04-06: qty 10

## 2017-04-06 MED ORDER — MIDAZOLAM HCL 2 MG/2ML IJ SOLN
INTRAMUSCULAR | Status: AC
  Filled 2017-04-06: qty 2

## 2017-04-06 MED ORDER — ACETAMINOPHEN 500 MG PO TABS
ORAL_TABLET | ORAL | Status: AC
  Filled 2017-04-06: qty 2

## 2017-04-06 SURGICAL SUPPLY — 50 items
BINDER BREAST LRG (GAUZE/BANDAGES/DRESSINGS) IMPLANT
BINDER BREAST MEDIUM (GAUZE/BANDAGES/DRESSINGS) IMPLANT
BINDER BREAST XLRG (GAUZE/BANDAGES/DRESSINGS) ×3 IMPLANT
BINDER BREAST XXLRG (GAUZE/BANDAGES/DRESSINGS) IMPLANT
BLADE SURG 15 STRL LF DISP TIS (BLADE) ×1 IMPLANT
BLADE SURG 15 STRL SS (BLADE) ×2
CANISTER SUC SOCK COL 7IN (MISCELLANEOUS) IMPLANT
CANISTER SUCT 1200ML W/VALVE (MISCELLANEOUS) IMPLANT
CHLORAPREP W/TINT 26ML (MISCELLANEOUS) ×3 IMPLANT
CLIP VESOCCLUDE SM WIDE 6/CT (CLIP) ×6 IMPLANT
COVER BACK TABLE 60X90IN (DRAPES) ×3 IMPLANT
COVER MAYO STAND STRL (DRAPES) ×3 IMPLANT
COVER PROBE W GEL 5X96 (DRAPES) ×3 IMPLANT
DECANTER SPIKE VIAL GLASS SM (MISCELLANEOUS) IMPLANT
DERMABOND ADVANCED (GAUZE/BANDAGES/DRESSINGS) ×4
DERMABOND ADVANCED .7 DNX12 (GAUZE/BANDAGES/DRESSINGS) ×2 IMPLANT
DEVICE DUBIN W/COMP PLATE 8390 (MISCELLANEOUS) ×3 IMPLANT
DRAPE LAPAROSCOPIC ABDOMINAL (DRAPES) ×3 IMPLANT
DRAPE UTILITY XL STRL (DRAPES) ×3 IMPLANT
DRSG PAD ABDOMINAL 8X10 ST (GAUZE/BANDAGES/DRESSINGS) ×3 IMPLANT
ELECT COATED BLADE 2.86 ST (ELECTRODE) ×3 IMPLANT
ELECT REM PT RETURN 9FT ADLT (ELECTROSURGICAL) ×3
ELECTRODE REM PT RTRN 9FT ADLT (ELECTROSURGICAL) ×1 IMPLANT
GLOVE BIO SURGEON STRL SZ7 (GLOVE) ×3 IMPLANT
GLOVE BIOGEL PI IND STRL 8 (GLOVE) ×1 IMPLANT
GLOVE BIOGEL PI INDICATOR 8 (GLOVE) ×2
GLOVE ECLIPSE 7.5 STRL STRAW (GLOVE) ×3 IMPLANT
GLOVE EXAM NITRILE MD LF STRL (GLOVE) ×3 IMPLANT
GOWN STRL REUS W/ TWL LRG LVL3 (GOWN DISPOSABLE) IMPLANT
GOWN STRL REUS W/ TWL XL LVL3 (GOWN DISPOSABLE) ×2 IMPLANT
GOWN STRL REUS W/TWL LRG LVL3 (GOWN DISPOSABLE)
GOWN STRL REUS W/TWL XL LVL3 (GOWN DISPOSABLE) ×4
ILLUMINATOR WAVEGUIDE N/F (MISCELLANEOUS) IMPLANT
KIT MARKER MARGIN INK (KITS) ×3 IMPLANT
NDL SAFETY ECLIPSE 18X1.5 (NEEDLE) ×1 IMPLANT
NEEDLE HYPO 18GX1.5 SHARP (NEEDLE) ×2
NEEDLE HYPO 25X1 1.5 SAFETY (NEEDLE) ×6 IMPLANT
NS IRRIG 1000ML POUR BTL (IV SOLUTION) IMPLANT
PACK BASIN DAY SURGERY FS (CUSTOM PROCEDURE TRAY) ×3 IMPLANT
PENCIL BUTTON HOLSTER BLD 10FT (ELECTRODE) ×3 IMPLANT
SLEEVE SCD COMPRESS KNEE MED (MISCELLANEOUS) ×3 IMPLANT
SPONGE LAP 4X18 X RAY DECT (DISPOSABLE) ×3 IMPLANT
SUT MON AB 5-0 PS2 18 (SUTURE) ×6 IMPLANT
SUT VICRYL 3-0 CR8 SH (SUTURE) ×6 IMPLANT
SYR CONTROL 10ML LL (SYRINGE) ×6 IMPLANT
TOWEL OR 17X24 6PK STRL BLUE (TOWEL DISPOSABLE) ×3 IMPLANT
TOWEL OR NON WOVEN STRL DISP B (DISPOSABLE) ×3 IMPLANT
TUBE CONNECTING 20'X1/4 (TUBING) ×1
TUBE CONNECTING 20X1/4 (TUBING) ×2 IMPLANT
YANKAUER SUCT BULB TIP NO VENT (SUCTIONS) ×3 IMPLANT

## 2017-04-06 NOTE — Discharge Instructions (Signed)
°Post Anesthesia Home Care Instructions ° °Activity: °Get plenty of rest for the remainder of the day. A responsible individual must stay with you for 24 hours following the procedure.  °For the next 24 hours, DO NOT: °-Drive a car °-Operate machinery °-Drink alcoholic beverages °-Take any medication unless instructed by your physician °-Make any legal decisions or sign important papers. ° °Meals: °Start with liquid foods such as gelatin or soup. Progress to regular foods as tolerated. Avoid greasy, spicy, heavy foods. If nausea and/or vomiting occur, drink only clear liquids until the nausea and/or vomiting subsides. Call your physician if vomiting continues. ° °Special Instructions/Symptoms: °Your throat may feel dry or sore from the anesthesia or the breathing tube placed in your throat during surgery. If this causes discomfort, gargle with warm salt water. The discomfort should disappear within 24 hours. ° °If you had a scopolamine patch placed behind your ear for the management of post- operative nausea and/or vomiting: ° °1. The medication in the patch is effective for 72 hours, after which it should be removed.  Wrap patch in a tissue and discard in the trash. Wash hands thoroughly with soap and water. °2. You may remove the patch earlier than 72 hours if you experience unpleasant side effects which may include dry mouth, dizziness or visual disturbances. °3. Avoid touching the patch. Wash your hands with soap and water after contact with the patch. °  ° ° ° ° °[facility] °Office Phone Number [phone] ° °BREAST BIOPSY/ PARTIAL MASTECTOMY: POST OP INSTRUCTIONS ° °Always review your discharge instruction sheet given to you by the facility where your surgery was performed. ° °IF YOU HAVE DISABILITY OR FAMILY LEAVE FORMS, YOU MUST BRING THEM TO THE OFFICE FOR PROCESSING.  DO NOT GIVE THEM TO YOUR DOCTOR. ° °1. A prescription for pain medication may be given to you upon discharge.  Take your  pain medication as prescribed, if needed.  If narcotic pain medicine is not needed, then you may take acetaminophen (Tylenol) or ibuprofen (Advil) as needed. °2. Take your usually prescribed medications unless otherwise directed °3. If you need a refill on your pain medication, please contact your pharmacy.  They will contact our office to request authorization.  Prescriptions will not be filled after 5pm or on week-ends. °4. You should eat very light the first 24 hours after surgery, such as soup, crackers, pudding, etc.  Resume your normal diet the day after surgery. °5. Most patients will experience some swelling and bruising in the breast.  Ice packs and a good support bra will help.  Swelling and bruising can take several days to resolve.  °6. It is common to experience some constipation if taking pain medication after surgery.  Increasing fluid intake and taking a stool softener will usually help or prevent this problem from occurring.  A mild laxative (Milk of Magnesia or Miralax) should be taken according to package directions if there are no bowel movements after 48 hours. °7. Unless discharge instructions indicate otherwise, you may remove your bandages 24-48 hours after surgery, and you may shower at that time.  You may have steri-strips (small skin tapes) in place directly over the incision.  These strips should be left on the skin for 7-10 days.  If your surgeon used skin glue on the incision, you may shower in 24 hours.  The glue will flake off over the next 2-3 weeks.  Any sutures or staples will be removed at the office during your follow-up visit. °  8. ACTIVITIES:  You may resume regular daily activities (gradually increasing) beginning the next day.  Wearing a good support bra or sports bra minimizes pain and swelling.  You may have sexual intercourse when it is comfortable. °a. You may drive when you no longer are taking prescription pain medication, you can comfortably wear a seatbelt, and you can  safely maneuver your car and apply brakes. °b. RETURN TO WORK:  ______________________________________________________________________________________ °9. You should see your doctor in the office for a follow-up appointment approximately two weeks after your surgery.  Your doctor’s nurse will typically make your follow-up appointment when she calls you with your pathology report.  Expect your pathology report 2-3 business days after your surgery.  You may call to check if you do not hear from us after three days. °10. OTHER INSTRUCTIONS: _______________________________________________________________________________________________ _____________________________________________________________________________________________________________________________________ °_____________________________________________________________________________________________________________________________________ °_____________________________________________________________________________________________________________________________________ ° °WHEN TO CALL YOUR DOCTOR: °1. Fever over 101.0 °2. Nausea and/or vomiting. °3. Extreme swelling or bruising. °4. Continued bleeding from incision. °5. Increased pain, redness, or drainage from the incision. ° °The clinic staff is available to answer your questions during regular business hours.  Please don’t hesitate to call and ask to speak to one of the nurses for clinical concerns.  If you have a medical emergency, go to the nearest emergency room or call 911.  A surgeon from [facility] is always on call at the hospital. ° °For further questions, please visit [email or web address]  °

## 2017-04-06 NOTE — Interval H&P Note (Signed)
History and Physical Interval Note:  04/06/2017 9:49 AM  Claire Carlson  has presented today for surgery, with the diagnosis of RIGHT BREAST CANCER  The various methods of treatment have been discussed with the patient and family. After consideration of risks, benefits and other options for treatment, the patient has consented to  Procedure(s): RIGHT BREAST LUMPECTOMY WITH RADIOACTIVE SEED X 2 AND SENTINEL LYMPH NODE BIOPSY (Right) as a surgical intervention .  The patient's history has been reviewed, patient examined, no change in status, stable for surgery.  I have reviewed the patient's chart and labs.  Questions were answered to the patient's satisfaction.     Darene Lamer Savanna Dooley

## 2017-04-06 NOTE — Op Note (Signed)
Preoperative Diagnosis: RIGHT BREAST CANCER  Postoprative Diagnosis: RIGHT BREAST CANCER  Procedure: Procedure(s): Blue dye injection right breast, RIGHT BREAST LUMPECTOMY WITH RADIOACTIVE SEED X 2 AND DEEP AXILLARY SENTINEL LYMPH NODE BIOPSY   Surgeon: Excell Seltzer T   Assistants: None  Anesthesia:  General LMA anesthesia  Indications: 77 year old female with a new diagnosis of cancer of the right breast, upper outer quadrant. Clinical stage Ia, ER positive, PR negative, HER-2 negative. 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 Both areas encompass a total of 4 cm in greatest diameter measuring about 1 cm each.  After extensive workup and discussion detailed elsewhere we have elected to proceed with radioactive seed x2 localized right breast lumpectomy and axillary sentinel lymph node biopsy as initial surgical treatment.    Procedure Detail: Patient had previously undergone accurate placement of 2 radioactive seeds at the 2 tumor and clip sites in the upper outer right breast.  In the holding area she underwent a pectoral block and underwent injection of 1 mCi of technetium sulfur colloid intradermally around the right nipple.  She was taken to the operating room, placed in the supine position on the operating table and laryngeal mask general anesthesia induced.  She is carefully positioned with her arm extended.  Examination of the axilla with the neoprobe revealed some but not markedly elevated counts.  I elected to use blue dye and after patient timeout under sterile technique I injected 5 cc of dilute methylene blue subcutaneously beneath the right nipple and massaged this for several minutes.  Following this the entire right breast and chest, axilla and upper arm were widely sterilely prepped and draped.  Patient timeout was again performed.  She had received preoperative IV antibiotics.  The lumpectomy was  approached initially.  I could localize the 2 seeds distinctly in the upper outer right breast, one at the areolar border and 1 more superior and lateral about 3 cm.  I used a curvilinear circumareolar incision and dissection was carried down into the subcutaneous tissue.  A skin and subcutaneous flap was raised superiorly and laterally.  Using the neoprobe for guidance I excised a single specimen encompassing both seeds extending the dissection from near the nipple out superiorly and laterally.  The specimen was removed and inked for margins.  It measured about 6 x 4 x 4 cm.  Specimen x-ray was obtained showing the marking clips and seeds well contained within the specimen.  This was sent for permanent pathology.  Hemostasis was obtained in the breast wound.  The lumpectomy cavity was marked with clips.  The deep breast and subcutaneous tissue was closed with interrupted 3-0 Vicryl.  Attention was turned to the sentinel node.  A relatively hot area in the axilla was localized and a small transverse incision made.  Dissection was carried down through the subcutaneous tissue.  Scarpa's fascia was incised.  I did not see any blue dye exploring the axilla.  Using the neoprobe for guidance I was able to dissect down onto a deep posterior node which was slightly enlarged and palpable with elevated counts.  This was exposed and excised with cautery.  Ex vivo this node had counts of about 350.  Further exploration in the axilla revealed another area of elevated counts more anteriorly and this was exposed and isolated and excised with cautery.  I did not feel a node in the second specimen but it did  have elevated counts of about 100.  There was still one area of elevated counts a little more anterior and superior in the axilla and with careful inspection and dissection I found a normal sized node with elevated counts which was excised and ex vivo had counts of about 200.  At this point background in the axilla was  essentially 0.  I do not see any blue dye and there were no palpable lymph nodes.  Hemostasis was assured and the deep axillary tissue was closed with interrupted 3-0 Vicryl.  Both skin incisions were closed with subcuticular 5-0 Monocryl after infiltrating skin and subcu with Marcaine.  Incisions were covered with Dermabond.  Sponge needle and instrument counts were correct.    Findings: As above  Estimated Blood Loss:  Minimal         Drains: None  Blood Given: none          Specimens: #1 right breast lumpectomy   #2 left axillary sentinel lymph node X 3        Complications:  * No complications entered in OR log *         Disposition: PACU - hemodynamically stable.         Condition: stable

## 2017-04-06 NOTE — Anesthesia Procedure Notes (Signed)
Procedure Name: LMA Insertion Date/Time: 04/06/2017 10:02 AM Performed by: Signe Colt, CRNA Pre-anesthesia Checklist: Patient identified, Emergency Drugs available, Suction available and Patient being monitored Patient Re-evaluated:Patient Re-evaluated prior to induction Oxygen Delivery Method: Circle system utilized Preoxygenation: Pre-oxygenation with 100% oxygen Induction Type: IV induction Ventilation: Mask ventilation without difficulty LMA: LMA inserted LMA Size: 4.0 Number of attempts: 1 Airway Equipment and Method: Bite block Placement Confirmation: positive ETCO2 Tube secured with: Tape Dental Injury: Teeth and Oropharynx as per pre-operative assessment

## 2017-04-06 NOTE — Anesthesia Postprocedure Evaluation (Signed)
Anesthesia Post Note  Patient: Claire Carlson  Procedure(s) Performed: RIGHT BREAST LUMPECTOMY WITH RADIOACTIVE SEED X 2 AND SENTINEL LYMPH NODE BIOPSY (Right Breast)     Patient location during evaluation: PACU Anesthesia Type: General Level of consciousness: awake and alert Pain management: pain level controlled Vital Signs Assessment: post-procedure vital signs reviewed and stable Respiratory status: spontaneous breathing, nonlabored ventilation, respiratory function stable and patient connected to nasal cannula oxygen Cardiovascular status: blood pressure returned to baseline and stable Postop Assessment: no apparent nausea or vomiting Anesthetic complications: no    Last Vitals:  Vitals:   04/06/17 0915 04/06/17 1130  BP: (!) 152/78 (!) 100/55  Pulse: (!) 54 69  Resp: 11 12  Temp:  36.5 C  SpO2: 100% 100%    Last Pain:  Vitals:   04/06/17 1130  TempSrc:   PainSc: Asleep                 Mimi Debellis

## 2017-04-06 NOTE — Anesthesia Procedure Notes (Addendum)
Anesthesia Regional Block: Pectoralis block   Pre-Anesthetic Checklist: ,, timeout performed, Correct Patient, Correct Site, Correct Laterality, Correct Procedure, Correct Position, site marked, Risks and benefits discussed,  Surgical consent,  Pre-op evaluation,  At surgeon's request and post-op pain management  Laterality: Right  Prep: chloraprep       Needles:  Injection technique: Single-shot  Needle Type: Echogenic Stimulator Needle     Needle Length: 5cm  Needle Gauge: 22     Additional Needles:   Procedures:, nerve stimulator,,, ultrasound used (permanent image in chart),,,,  Narrative:  Start time: 04/06/2017 9:17 AM End time: 04/06/2017 9:23 AM Injection made incrementally with aspirations every 5 mL.  Performed by: Personally  Anesthesiologist: Janeece Riggers, MD  Additional Notes: Functioning IV was confirmed and monitors were applied.  A 41mm 22ga Arrow echogenic stimulator needle was used. Sterile prep and drape,hand hygiene and sterile gloves were used. Ultrasound guidance: relevant anatomy identified, needle position confirmed, local anesthetic spread visualized around nerve(s)., vascular puncture avoided.  Image printed for medical record. Negative aspiration and negative test dose prior to incremental administration of local anesthetic. The patient tolerated the procedure well.

## 2017-04-06 NOTE — Transfer of Care (Signed)
Immediate Anesthesia Transfer of Care Note  Patient: Claire Carlson  Procedure(s) Performed: RIGHT BREAST LUMPECTOMY WITH RADIOACTIVE SEED X 2 AND SENTINEL LYMPH NODE BIOPSY (Right Breast)  Patient Location: PACU  Anesthesia Type:GA combined with regional for post-op pain  Level of Consciousness: awake and patient cooperative  Airway & Oxygen Therapy: Patient Spontanous Breathing and Patient connected to face mask oxygen  Post-op Assessment: Report given to RN and Post -op Vital signs reviewed and stable  Post vital signs: Reviewed and stable  Last Vitals:  Vitals:   04/06/17 0900 04/06/17 0915  BP: (!) 145/75 (!) 152/78  Pulse: (!) 55 (!) 54  Resp: 10 11  Temp:    SpO2: 99% 100%    Last Pain:  Vitals:   04/06/17 0808  TempSrc: Oral  PainSc: 0-No pain         Complications: No apparent anesthesia complications

## 2017-04-06 NOTE — Progress Notes (Signed)
Assisted Dr. Oddono with right, ultrasound guided, pectoralis block. Side rails up, monitors on throughout procedure. See vital signs in flow sheet. Tolerated Procedure well. 

## 2017-04-09 ENCOUNTER — Encounter (HOSPITAL_BASED_OUTPATIENT_CLINIC_OR_DEPARTMENT_OTHER): Payer: Self-pay | Admitting: General Surgery

## 2017-04-09 NOTE — Addendum Note (Signed)
Addendum  created 04/09/17 1836 by Janeece Riggers, MD   Intraprocedure Blocks edited, Sign clinical note

## 2017-04-20 DIAGNOSIS — C50911 Malignant neoplasm of unspecified site of right female breast: Secondary | ICD-10-CM

## 2017-05-03 DIAGNOSIS — Z780 Asymptomatic menopausal state: Secondary | ICD-10-CM

## 2017-05-03 HISTORY — DX: Asymptomatic menopausal state: Z78.0

## 2017-05-04 DIAGNOSIS — Z170 Estrogen receptor positive status [ER+]: Secondary | ICD-10-CM | POA: Diagnosis not present

## 2017-05-04 DIAGNOSIS — C50911 Malignant neoplasm of unspecified site of right female breast: Secondary | ICD-10-CM

## 2017-05-11 DIAGNOSIS — C50911 Malignant neoplasm of unspecified site of right female breast: Secondary | ICD-10-CM

## 2017-05-11 DIAGNOSIS — M8580 Other specified disorders of bone density and structure, unspecified site: Secondary | ICD-10-CM | POA: Diagnosis not present

## 2017-05-11 DIAGNOSIS — J329 Chronic sinusitis, unspecified: Secondary | ICD-10-CM | POA: Diagnosis not present

## 2017-05-28 ENCOUNTER — Encounter (HOSPITAL_COMMUNITY): Payer: Self-pay

## 2017-05-31 DIAGNOSIS — Z170 Estrogen receptor positive status [ER+]: Secondary | ICD-10-CM | POA: Diagnosis not present

## 2017-05-31 DIAGNOSIS — C50911 Malignant neoplasm of unspecified site of right female breast: Secondary | ICD-10-CM

## 2017-06-25 DIAGNOSIS — C50911 Malignant neoplasm of unspecified site of right female breast: Secondary | ICD-10-CM | POA: Diagnosis not present

## 2017-06-25 DIAGNOSIS — J4531 Mild persistent asthma with (acute) exacerbation: Secondary | ICD-10-CM | POA: Diagnosis not present

## 2017-07-03 DIAGNOSIS — C50911 Malignant neoplasm of unspecified site of right female breast: Secondary | ICD-10-CM | POA: Diagnosis not present

## 2017-07-03 DIAGNOSIS — J40 Bronchitis, not specified as acute or chronic: Secondary | ICD-10-CM | POA: Diagnosis not present

## 2017-07-03 DIAGNOSIS — J329 Chronic sinusitis, unspecified: Secondary | ICD-10-CM | POA: Diagnosis not present

## 2017-08-28 DIAGNOSIS — Z79811 Long term (current) use of aromatase inhibitors: Secondary | ICD-10-CM | POA: Diagnosis not present

## 2017-08-28 DIAGNOSIS — Z923 Personal history of irradiation: Secondary | ICD-10-CM

## 2017-08-28 DIAGNOSIS — C50911 Malignant neoplasm of unspecified site of right female breast: Secondary | ICD-10-CM

## 2017-08-28 DIAGNOSIS — Z170 Estrogen receptor positive status [ER+]: Secondary | ICD-10-CM | POA: Diagnosis not present

## 2017-10-15 ENCOUNTER — Encounter: Payer: Self-pay | Admitting: Cardiology

## 2017-10-15 ENCOUNTER — Ambulatory Visit (INDEPENDENT_AMBULATORY_CARE_PROVIDER_SITE_OTHER): Payer: Medicare Other | Admitting: Cardiology

## 2017-10-15 VITALS — BP 118/72 | HR 58 | Ht 65.0 in | Wt 174.0 lb

## 2017-10-15 DIAGNOSIS — R600 Localized edema: Secondary | ICD-10-CM | POA: Diagnosis not present

## 2017-10-15 DIAGNOSIS — I4729 Other ventricular tachycardia: Secondary | ICD-10-CM

## 2017-10-15 DIAGNOSIS — E782 Mixed hyperlipidemia: Secondary | ICD-10-CM

## 2017-10-15 DIAGNOSIS — I10 Essential (primary) hypertension: Secondary | ICD-10-CM | POA: Diagnosis not present

## 2017-10-15 DIAGNOSIS — I472 Ventricular tachycardia: Secondary | ICD-10-CM

## 2017-10-15 DIAGNOSIS — I493 Ventricular premature depolarization: Secondary | ICD-10-CM | POA: Diagnosis not present

## 2017-10-15 HISTORY — DX: Localized edema: R60.0

## 2017-10-15 NOTE — Progress Notes (Signed)
Cardiology Office Note:    Date:  10/15/2017   ID:  Claire Carlson, DOB 1940-11-30, MRN [id number]  PCP:  Raina Mina., MD  Cardiologist:  Jenean Lindau, MD   Referring MD: Raina Mina., MD    ASSESSMENT:    1. Essential hypertension   2. PVC (premature ventricular contraction)   3. Nonsustained ventricular tachycardia ([place])   4. Mixed hyperlipidemia   5. Pedal edema    PLAN:    In order of problems listed above:  1. Primary prevention stressed with the patient.  Importance of compliance with diet and medication stressed and she vocalized understanding.  Her blood pressure is stable.  Diet was discussed. 2. Results of the test were discussed with the patient.  In view of the fact she complains of bilateral leg swelling left greater than the right on some days I will do a DVT study to rule out any deep venous thrombosis. 3. She will be seen in follow-up appointment in 6 months or earlier if she has any concerns.   Medication Adjustments/Labs and Tests Ordered: Current medicines are reviewed at length with the patient today.  Concerns regarding medicines are outlined above.  No orders of the defined types were placed in this encounter.  No orders of the defined types were placed in this encounter.    No chief complaint on file.    History of Present Illness:    Claire Carlson is a 77 y.o. female..  Patient has history of essential hypertension.  She denies any problems at this time from a cardiovascular standpoint.  No chest pain orthopnea or PND.  She mentions to me that her legs swell at times.  Left greater than the right.  She tells me that it is not the case so today.  No chest pain orthopnea or PND.  No palpitations.  At the time of my evaluation, the patient is alert awake oriented and in no distress.  Past Medical History:  Diagnosis Date  . Anxiety   . Barrett's esophagus   . Cancer ([place]) 02/2017   right breast cancer  . Complication of  anesthesia   . Dysrhythmia    PVC's, PAC's  . GERD (gastroesophageal reflux disease)   . Hypertension   . Hypothyroidism   . PONV (postoperative nausea and vomiting)   . PVC (premature ventricular contraction)   . Vertigo     Past Surgical History:  Procedure Laterality Date  . BREAST LUMPECTOMY WITH RADIOACTIVE SEED AND SENTINEL LYMPH NODE BIOPSY Right 04/06/2017   Procedure: RIGHT BREAST LUMPECTOMY WITH RADIOACTIVE SEED X 2 AND SENTINEL LYMPH NODE BIOPSY;  Surgeon: Excell Seltzer, MD;  Location: [facility];  Service: General;  Laterality: Right;  . CESAREAN SECTION    . CHOLECYSTECTOMY    . EYE SURGERY     cataracts  . MOUTH SURGERY    . TONSILLECTOMY      Current Medications: Current Meds  Medication Sig  . ALPRAZolam (XANAX) 0.5 MG tablet Take 0.5 mg by mouth daily as needed.  Marland Kitchen anastrozole (ARIMIDEX) 1 MG tablet Take 1 mg by mouth daily.  Marland Kitchen atenolol (TENORMIN) 25 MG tablet Take 12.5 mg by mouth daily.  . Biotin 5000 MCG TABS Take 1,000 mcg by mouth 3 (three) times daily.   . Calcium Carbonate-Vitamin D (CALTRATE 600+D) 600-400 MG-UNIT tablet Take 1 tablet by mouth daily.  . Cholecalciferol (VITAMIN D-1000 MAX ST) 1000 units tablet Take 2,000 Units by mouth daily.   Marland Kitchen  fluticasone (FLONASE) 50 MCG/ACT nasal spray Place 1 spray into both nostrils as needed.   Javier Docker Oil 500 MG CAPS Take by mouth.  . levothyroxine (SYNTHROID, LEVOTHROID) 88 MCG tablet Take 88 mcg by mouth daily.  Marland Kitchen losartan-hydrochlorothiazide (HYZAAR) 100-12.5 MG tablet Take 1 tablet by mouth daily.  . Meclizine HCl 25 MG CHEW Chew 25 mg by mouth daily as needed.  . Multiple Vitamin (MULTIVITAMIN WITH MINERALS) TABS tablet Take 1 tablet by mouth daily.  Marland Kitchen omeprazole (PRILOSEC) 20 MG capsule Take 20 mg by mouth daily.  . Probiotic Product (PROBIOTIC ADVANCED) CAPS Take by mouth.  . vitamin C (ASCORBIC ACID) 500 MG tablet Take 500 mg by mouth daily.  . vitamin E 200 UNIT capsule Take 200  Units by mouth daily.   . zoledronic acid (RECLAST) 5 MG/100ML SOLN injection Inject 5 mg into the vein.     Allergies:   Penicillin g; Rosuvastatin; and Biaxin [clarithromycin]   Social History   Socioeconomic History  . Marital status: Married    Spouse name: Not on file  . Number of children: Not on file  . Years of education: Not on file  . Highest education level: Not on file  Occupational History  . Not on file  Social Needs  . Financial resource strain: Not on file  . Food insecurity:    Worry: Not on file    Inability: Not on file  . Transportation needs:    Medical: Not on file    Non-medical: Not on file  Tobacco Use  . Smoking status: Former Smoker    Last attempt to quit: 1985    Years since quitting: 34.5  . Smokeless tobacco: Never Used  Substance and Sexual Activity  . Alcohol use: No  . Drug use: No  . Sexual activity: Not on file  Lifestyle  . Physical activity:    Days per week: Not on file    Minutes per session: Not on file  . Stress: Not on file  Relationships  . Social connections:    Talks on phone: Not on file    Gets together: Not on file    Attends religious service: Not on file    Active member of club or organization: Not on file    Attends meetings of clubs or organizations: Not on file    Relationship status: Not on file  Other Topics Concern  . Not on file  Social History Narrative  . Not on file     Family History: The patient's family history includes Heart attack in her father.  ROS:   Please see the history of present illness.    All other systems reviewed and are negative.  EKGs/Labs/Other Studies Reviewed:    The following studies were reviewed today: I discussed findings of the echocardiogram with her at length.  She is not taking any chemotherapy at this time.  Next   Recent Labs: 04/02/2017: BUN 17; Creatinine, Ser 1.15; Potassium 5.1; Sodium 135  Recent Lipid Panel No results found for: CHOL, TRIG, HDL,  CHOLHDL, VLDL, LDLCALC, LDLDIRECT  Physical Exam:    VS:  BP 118/72 (BP Location: Right Arm, Patient Position: Sitting, Cuff Size: Normal)   Pulse (!) 58   Ht 5\' 5"  (1.651 m)   Wt 174 lb (78.9 kg)   SpO2 98%   BMI 28.96 kg/m     Wt Readings from Last 3 Encounters:  10/15/17 174 lb (78.9 kg)  04/06/17 172 lb 4 oz (78.1  kg)  03/14/17 170 lb 6.4 oz (77.3 kg)     GEN: Patient is in no acute distress HEENT: Normal NECK: No JVD; No carotid bruits LYMPHATICS: No lymphadenopathy CARDIAC: Hear sounds regular, 2/6 systolic murmur at the apex. RESPIRATORY:  Clear to auscultation without rales, wheezing or rhonchi  ABDOMEN: Soft, non-tender, non-distended MUSCULOSKELETAL:  No edema; No deformity  SKIN: Warm and dry NEUROLOGIC:  Alert and oriented x 3 PSYCHIATRIC:  Normal affect   Signed, Jenean Lindau, MD  10/15/2017 2:03 PM    [place]

## 2017-10-15 NOTE — Patient Instructions (Signed)
Medication Instructions:  Your physician recommends that you continue on your current medications as directed. Please refer to the Current Medication list given to you today.  Labwork: None  Testing/Procedures: Your physician has requested that you have a lower or upper extremity venous duplex. This test is an ultrasound of the veins in the legs or arms. It looks at venous blood flow that carries blood from the heart to the legs or arms. Allow one hour for a Lower Venous exam. Allow thirty minutes for an Upper Venous exam. There are no restrictions or special instructions.  Follow-Up: Your physician recommends that you schedule a follow-up appointment in: 6 months  Any Other Special Instructions Will Be Listed Below (If Applicable).     If you need a refill on your cardiac medications before your next appointment, please call your pharmacy.   [place], RN, BSN

## 2017-10-26 ENCOUNTER — Telehealth: Payer: Self-pay | Admitting: *Deleted

## 2017-10-26 ENCOUNTER — Other Ambulatory Visit: Payer: Self-pay | Admitting: *Deleted

## 2017-10-26 DIAGNOSIS — R600 Localized edema: Secondary | ICD-10-CM

## 2017-10-26 NOTE — Telephone Encounter (Signed)
Patient informed of bilateral lower extremity ultrasound results. Patient verbalized understanding. No further questions.

## 2018-01-28 DIAGNOSIS — M8580 Other specified disorders of bone density and structure, unspecified site: Secondary | ICD-10-CM

## 2018-01-28 DIAGNOSIS — C50911 Malignant neoplasm of unspecified site of right female breast: Secondary | ICD-10-CM

## 2018-01-28 DIAGNOSIS — Z923 Personal history of irradiation: Secondary | ICD-10-CM

## 2018-01-28 DIAGNOSIS — Z79811 Long term (current) use of aromatase inhibitors: Secondary | ICD-10-CM | POA: Diagnosis not present

## 2018-01-28 DIAGNOSIS — Z170 Estrogen receptor positive status [ER+]: Secondary | ICD-10-CM | POA: Diagnosis not present

## 2018-01-28 DIAGNOSIS — Z9221 Personal history of antineoplastic chemotherapy: Secondary | ICD-10-CM | POA: Diagnosis not present

## 2018-05-15 ENCOUNTER — Ambulatory Visit (INDEPENDENT_AMBULATORY_CARE_PROVIDER_SITE_OTHER): Payer: Medicare Other | Admitting: Cardiology

## 2018-05-15 ENCOUNTER — Encounter: Payer: Self-pay | Admitting: Cardiology

## 2018-05-15 VITALS — BP 116/68 | HR 57 | Ht 65.0 in | Wt 171.0 lb

## 2018-05-15 DIAGNOSIS — I10 Essential (primary) hypertension: Secondary | ICD-10-CM | POA: Diagnosis not present

## 2018-05-15 DIAGNOSIS — E782 Mixed hyperlipidemia: Secondary | ICD-10-CM

## 2018-05-15 NOTE — Progress Notes (Signed)
Cardiology Office Note:    Date:  05/15/2018   ID:  Claire Carlson, DOB 1940/12/20, MRN [id number]  PCP:  Raina Mina., MD  Cardiologist:  Jenean Lindau, MD   Referring MD: Raina Mina., MD    ASSESSMENT:    1. Essential hypertension   2. Mixed hyperlipidemia    PLAN:    In order of problems listed above:  1. Primary prevention stressed with the patient.  Importance of compliance with diet and medication stressed and she vocalized understanding.  Her blood pressure is stable.  Diet was discussed for dyslipidemia.  Her lipids have always been very elevated.  She has not tolerated rosuvastatin.  She is willing to try other medications she will be back in the morning for fasting blood work.  I will review her lipids and would like to start her on atorvastatin 10 mg daily but still elevated.  She is agreeable. 2. Patient will be seen in follow-up appointment in 6 months or earlier if the patient has any concerns    Medication Adjustments/Labs and Tests Ordered: Current medicines are reviewed at length with the patient today.  Concerns regarding medicines are outlined above.  No orders of the defined types were placed in this encounter.  No orders of the defined types were placed in this encounter.    No chief complaint on file.    History of Present Illness:    Claire Carlson is a 78 y.o. female.  Patient was evaluated for essential hypertension.  She had a history of chest discomfort evaluation was unremarked she is done fine and very happy with her blood pressure.  She has history of dyslipidemia.  At the time of my evaluation, the patient is alert awake oriented and in no distress.  She is not doing too well with her diet and such intervention.  Past Medical History:  Diagnosis Date  . Anxiety   . Barrett's esophagus   . Cancer ([place]) 02/2017   right breast cancer  . Complication of anesthesia   . Dysrhythmia    PVC's, PAC's  . GERD (gastroesophageal  reflux disease)   . Hypertension   . Hypothyroidism   . PONV (postoperative nausea and vomiting)   . PVC (premature ventricular contraction)   . Vertigo     Past Surgical History:  Procedure Laterality Date  . BREAST LUMPECTOMY WITH RADIOACTIVE SEED AND SENTINEL LYMPH NODE BIOPSY Right 04/06/2017   Procedure: RIGHT BREAST LUMPECTOMY WITH RADIOACTIVE SEED X 2 AND SENTINEL LYMPH NODE BIOPSY;  Surgeon: Excell Seltzer, MD;  Location: [facility];  Service: General;  Laterality: Right;  . CESAREAN SECTION    . CHOLECYSTECTOMY    . EYE SURGERY     cataracts  . MOUTH SURGERY    . TONSILLECTOMY      Current Medications: Current Meds  Medication Sig  . anastrozole (ARIMIDEX) 1 MG tablet Take 1 mg by mouth daily.  Marland Kitchen atenolol (TENORMIN) 25 MG tablet Take 12.5 mg by mouth daily.  . Calcium Carbonate-Vitamin D (CALTRATE 600+D) 600-400 MG-UNIT tablet Take 1 tablet by mouth daily.  . Cholecalciferol (VITAMIN D-1000 MAX ST) 1000 units tablet Take 2,000 Units by mouth daily.   Marland Kitchen levothyroxine (SYNTHROID, LEVOTHROID) 88 MCG tablet Take 88 mcg by mouth daily.  Marland Kitchen lisinopril-hydrochlorothiazide (PRINZIDE,ZESTORETIC) 10-12.5 MG tablet Take 1 tablet by mouth daily.  . Meclizine HCl 25 MG CHEW Chew 25 mg by mouth daily as needed.  . Multiple Vitamin (MULTIVITAMIN WITH MINERALS) TABS tablet  Take 1 tablet by mouth daily.  Marland Kitchen omeprazole (PRILOSEC) 20 MG capsule Take 20 mg by mouth daily.  . Probiotic Product (PROBIOTIC ADVANCED) CAPS Take by mouth.  . vitamin E 200 UNIT capsule Take 200 Units by mouth daily.   . zoledronic acid (RECLAST) 5 MG/100ML SOLN injection Inject 5 mg into the vein.     Allergies:   Penicillin g; Rosuvastatin; and Biaxin [clarithromycin]   Social History   Socioeconomic History  . Marital status: Married    Spouse name: Not on file  . Number of children: Not on file  . Years of education: Not on file  . Highest education level: Not on file  Occupational  History  . Not on file  Social Needs  . Financial resource strain: Not on file  . Food insecurity:    Worry: Not on file    Inability: Not on file  . Transportation needs:    Medical: Not on file    Non-medical: Not on file  Tobacco Use  . Smoking status: Former Smoker    Last attempt to quit: 1985    Years since quitting: 35.1  . Smokeless tobacco: Never Used  Substance and Sexual Activity  . Alcohol use: No  . Drug use: No  . Sexual activity: Not on file  Lifestyle  . Physical activity:    Days per week: Not on file    Minutes per session: Not on file  . Stress: Not on file  Relationships  . Social connections:    Talks on phone: Not on file    Gets together: Not on file    Attends religious service: Not on file    Active member of club or organization: Not on file    Attends meetings of clubs or organizations: Not on file    Relationship status: Not on file  Other Topics Concern  . Not on file  Social History Narrative  . Not on file     Family History: The patient's family history includes Heart attack in her father.  ROS:   Please see the history of present illness.    All other systems reviewed and are negative.  EKGs/Labs/Other Studies Reviewed:    The following studies were reviewed today: Results of stress test were discussed with the patient at length.   Recent Labs: No results found for requested labs within last 8760 hours.  Recent Lipid Panel No results found for: CHOL, TRIG, HDL, CHOLHDL, VLDL, LDLCALC, LDLDIRECT  Physical Exam:    VS:  BP 116/68 (BP Location: Right Arm, Patient Position: Sitting, Cuff Size: Normal)   Pulse (!) 57   Ht 5\' 5"  (1.651 m)   Wt 171 lb (77.6 kg)   SpO2 98%   BMI 28.46 kg/m     Wt Readings from Last 3 Encounters:  05/15/18 171 lb (77.6 kg)  10/15/17 174 lb (78.9 kg)  04/06/17 172 lb 4 oz (78.1 kg)     GEN: Patient is in no acute distress HEENT: Normal NECK: No JVD; No carotid bruits LYMPHATICS: No  lymphadenopathy CARDIAC: Hear sounds regular, 2/6 systolic murmur at the apex. RESPIRATORY:  Clear to auscultation without rales, wheezing or rhonchi  ABDOMEN: Soft, non-tender, non-distended MUSCULOSKELETAL:  No edema; No deformity  SKIN: Warm and dry NEUROLOGIC:  Alert and oriented x 3 PSYCHIATRIC:  Normal affect   Signed, Jenean Lindau, MD  05/15/2018 11:47 AM    [place]

## 2018-05-15 NOTE — Addendum Note (Signed)
Addended by: Anselm Pancoast on: 05/15/2018 04:27 PM   Modules accepted: Orders

## 2018-05-15 NOTE — Patient Instructions (Signed)
Medication Instructions:  Your physician recommends that you continue on your current medications as directed. Please refer to the Current Medication list given to you today.  If you need a refill on your cardiac medications before your next appointment, please call your pharmacy.   Lab work: Your physician recommends that you return for lab work Tomorrow: BMP,LFT and Lipids  If you have labs (blood work) drawn today and your tests are completely normal, you will receive your results only by: Marland Kitchen MyChart Message (if you have MyChart) OR . A paper copy in the mail If you have any lab test that is abnormal or we need to change your treatment, we will call you to review the results.  Testing/Procedures: None  Follow-Up: At [facility], you and your health needs are our priority.  As part of our continuing mission to provide you with exceptional heart care, we have created designated Provider Care Teams.  These Care Teams include your primary Cardiologist (physician) and Advanced Practice Providers (APPs -  Physician Assistants and Nurse Practitioners) who all work together to provide you with the care you need, when you need it. You will need a follow up appointment in 6 months.  Please call our office 2 months in advance to schedule this appointment.  You may see No primary care provider on file. or another member of our [employer] Provider Team in [place]: Jenne Campus, MD . Shirlee More, MD  Any Other Special Instructions Will Be Listed Below (If Applicable).

## 2018-05-16 LAB — BASIC METABOLIC PANEL
BUN/Creatinine Ratio: 15 (ref 12–28)
BUN: 17 mg/dL (ref 8–27)
CO2: 26 mmol/L (ref 20–29)
CREATININE: 1.12 mg/dL — AB (ref 0.57–1.00)
Calcium: 9.6 mg/dL (ref 8.7–10.3)
Chloride: 100 mmol/L (ref 96–106)
GFR calc Af Amer: 54 mL/min/{1.73_m2} — ABNORMAL LOW (ref >59)
GFR, EST NON AFRICAN AMERICAN: 47 mL/min/{1.73_m2} — AB (ref >59)
GLUCOSE: 93 mg/dL (ref 65–99)
Potassium: 5.1 mmol/L (ref 3.5–5.2)
SODIUM: 141 mmol/L (ref 134–144)

## 2018-05-16 LAB — HEPATIC FUNCTION PANEL
ALBUMIN: 4.4 g/dL (ref 3.7–4.7)
ALK PHOS: 52 IU/L (ref 39–117)
ALT: 16 IU/L (ref 0–32)
AST: 21 IU/L (ref 0–40)
Bilirubin Total: 0.4 mg/dL (ref 0.0–1.2)
Bilirubin, Direct: 0.1 mg/dL (ref 0.00–0.40)
TOTAL PROTEIN: 6.7 g/dL (ref 6.0–8.5)

## 2018-05-16 LAB — LIPID PANEL
CHOL/HDL RATIO: 4.5 ratio — AB (ref 0.0–4.4)
Cholesterol, Total: 219 mg/dL — ABNORMAL HIGH (ref 100–199)
HDL: 49 mg/dL (ref >39)
LDL CALC: 134 mg/dL — AB (ref 0–99)
TRIGLYCERIDES: 182 mg/dL — AB (ref 0–149)
VLDL Cholesterol Cal: 36 mg/dL (ref 5–40)

## 2018-05-17 ENCOUNTER — Telehealth: Payer: Self-pay

## 2018-05-17 MED ORDER — ATORVASTATIN CALCIUM 10 MG PO TABS: 10.0000 mg | ORAL_TABLET | Freq: Every day | ORAL | 3 refills | Status: DC

## 2018-05-17 NOTE — Telephone Encounter (Signed)
Rn relayed information to patient, she agrees to try lipitor. Requested medication sent over to [facility] per patient request. No further questions at this time.

## 2018-05-17 NOTE — Telephone Encounter (Signed)
-----   Message from Jenean Lindau, MD sent at 05/17/2018  9:57 AM EST ----- Lipids are elevated.  Given an option of starting atorvastatin 10 mg daily and liver lipid check in 6 weeks.  Alternatively she can try to diet and check in 3 months. Jenean Lindau, MD 05/17/2018 9:57 AM

## 2018-05-20 ENCOUNTER — Telehealth: Payer: Self-pay | Admitting: Cardiology

## 2018-05-20 NOTE — Telephone Encounter (Signed)
Has questions about how much Co Q 10 to take

## 2018-05-20 NOTE — Telephone Encounter (Signed)
Patient informed to take 200 mg daily of co q 10 per Dr. Geraldo Pitter.

## 2018-05-20 NOTE — Telephone Encounter (Signed)
Patient reports Dr. Geraldo Pitter had advised her she could take co q 10 for muscle cramps along with lipitor. However she didn't get a dosage from him. She would like to know hat dose he recommends her take. I will forward to him and follow up with patient.

## 2018-05-20 NOTE — Telephone Encounter (Signed)
200mg  qd

## 2018-05-27 ENCOUNTER — Telehealth: Payer: Self-pay | Admitting: Cardiology

## 2018-05-27 NOTE — Telephone Encounter (Signed)
Stop lipitor and let us know in a week how he feels.

## 2018-05-27 NOTE — Telephone Encounter (Signed)
Is having problems with atorvastatin

## 2018-05-27 NOTE — Telephone Encounter (Signed)
Informed patient to stop medication and to callus back in 1 week to let us know how she is feeling. She verbally understands.

## 2018-05-27 NOTE — Telephone Encounter (Signed)
Patient was seen on 05/15/2018 and lipitor 10 mg daily was started after lab results were reviewed by Dr. Geraldo Pitter. Patient reports excruciating muscle pain after taking lipitor for 9 days. Patient has still been taking this medication as she wanted to see what Dr. Geraldo Pitter advised before she stopped taking it. Will have Dr. Geraldo Pitter advise.

## 2018-06-03 ENCOUNTER — Telehealth: Payer: Self-pay | Admitting: Cardiology

## 2018-06-03 NOTE — Telephone Encounter (Signed)
Patient has been off of lipitor for one week as advised by Dr. Geraldo Pitter. I will route to him for further instruction.

## 2018-06-03 NOTE — Telephone Encounter (Signed)
Please give her an appointment to come in and discuss this.

## 2018-06-03 NOTE — Telephone Encounter (Signed)
Patient called back to report how she is feeling after being off meds for 1 week. She is feeling some weak and muscles and arms and legs still a bit. Please call patient with directions

## 2018-06-03 NOTE — Telephone Encounter (Signed)
Patient scheduled for 06/11/18 @ 0940 to discuss lipitor discontinuation.

## 2018-06-11 ENCOUNTER — Other Ambulatory Visit: Payer: Self-pay

## 2018-06-11 ENCOUNTER — Ambulatory Visit (INDEPENDENT_AMBULATORY_CARE_PROVIDER_SITE_OTHER): Payer: Medicare Other | Admitting: Cardiology

## 2018-06-11 ENCOUNTER — Encounter: Payer: Self-pay | Admitting: Cardiology

## 2018-06-11 VITALS — BP 126/78 | HR 52 | Ht 65.0 in | Wt 169.0 lb

## 2018-06-11 DIAGNOSIS — E782 Mixed hyperlipidemia: Secondary | ICD-10-CM

## 2018-06-11 DIAGNOSIS — I4729 Other ventricular tachycardia: Secondary | ICD-10-CM

## 2018-06-11 DIAGNOSIS — I472 Ventricular tachycardia: Secondary | ICD-10-CM | POA: Diagnosis not present

## 2018-06-11 DIAGNOSIS — I10 Essential (primary) hypertension: Secondary | ICD-10-CM | POA: Diagnosis not present

## 2018-06-11 DIAGNOSIS — I493 Ventricular premature depolarization: Secondary | ICD-10-CM | POA: Diagnosis not present

## 2018-06-11 MED ORDER — PITAVASTATIN CALCIUM 1 MG PO TABS: 1.0000 mg | ORAL_TABLET | Freq: Every day | ORAL | 3 refills | Status: DC

## 2018-06-11 NOTE — Patient Instructions (Signed)
Medication Instructions:  START taking pitavastatin 1 mg (1 tablet) once daily   If you need a refill on your cardiac medications before your next appointment, please call your pharmacy.   Lab work: NONE today. You will need to come in to office in 6 weeks to have a Lipid panel and LFT drawn. Come anytime between 8-4 with exception of 12-1.    If you have labs (blood work) drawn today and your tests are completely normal, you will receive your results only by: Marland Kitchen MyChart Message (if you have MyChart) OR . A paper copy in the mail If you have any lab test that is abnormal or we need to change your treatment, we will call you to review the results.  Testing/Procedures: NONE  Follow-Up: At [facility], you and your health needs are our priority.  As part of our continuing mission to provide you with exceptional heart care, we have created designated Provider Care Teams.  These Care Teams include your primary Cardiologist (physician) and Advanced Practice Providers (APPs -  Physician Assistants and Nurse Practitioners) who all work together to provide you with the care you need, when you need it. You will need a follow up appointment in 6 months.     Any Other Special Instructions Will Be Listed Below   Pitavastatin oral tablets What is this medicine? PITAVASTATIN (pit A va STAT in) is known as a HMG-CoA reductase inhibitor or 'statin'. It lowers the level of cholesterol and triglycerides in the blood. Diet and lifestyle changes are often used with this drug. This medicine may be used for other purposes; ask your health care provider or pharmacist if you have questions. COMMON BRAND NAME(S): Livalo, Zypitamag What should I tell my health care provider before I take this medicine? They need to know if you have any of these conditions: -diabetes -if you often drink alcohol -history of stroke -kidney disease -liver disease -muscle aches or weakness -thyroid disease -an unusual or  allergic reaction to pitavastatin, other medicines, foods, dyes, or preservatives -pregnant or trying to get pregnant -breast-feeding How should I use this medicine? Take this medicine by mouth with a glass of water. Follow the directions on the prescription label. You can take it with or without food. If it upsets your stomach, take it with food. Take your medicine at regular intervals. Do not take it more often than directed. Do not stop taking except on your doctor's advice. Talk to your pediatrician regarding the use of this medicine in children. While this drug may be prescribed for children as young as 8 years for selected conditions, precautions do apply. Overdosage: If you think you have taken too much of this medicine contact a poison control center or emergency room at once. NOTE: This medicine is only for you. Do not share this medicine with others. What if I miss a dose? If you miss a dose, take it as soon as you can. If it is almost time for your next dose, take only that dose. Do not take double or extra doses. What may interact with this medicine? Do not take this medicine with any of the following medications: - cyclosporine - gemfibrozil - herbal medicines like red yeast rice This medicine may also interact with the following medications: - alcohol - antiviral medicines for HIV or AIDS - erythromycin - other medicines for cholesterol - rifampin - warfarin This list may not describe all possible interactions. Give your health care provider a list of all the medicines,  herbs, non-prescription drugs, or dietary supplements you use. Also tell them if you smoke, drink alcohol, or use illegal drugs. Some items may interact with your medicine. What should I watch for while using this medicine? Visit your doctor or health care professional for regular check-ups. You may need regular tests to make sure your liver is working properly. Your health care professional may tell you to stop  taking this medicine if you develop muscle problems. If your muscle problems do not go away after stopping this medicine, contact your health care professional. Do not become pregnant while taking this medicine. Women should inform their health care professional if they wish to become pregnant or think they might be pregnant. There is a potential for serious side effects to an unborn child. Talk to your health care professional or pharmacist for more information. Do not breast-feed an infant while taking this medicine. This medicine may affect blood sugar levels. If you have diabetes, check with your doctor or health care professional before you change your diet or the dose of your diabetic medicine. If you are going to need surgery or other procedure, tell your doctor that you are using this medicine. This drug is only part of a total heart-health program. Your doctor or a dietician can suggest a low-cholesterol and low-fat diet to help. Avoid alcohol and smoking, and keep a proper exercise schedule. This medicine may cause a decrease in Co-Enzyme Q-10. You should make sure that you get enough Co-Enzyme Q-10 while you are taking this medicine. Discuss the foods you eat and the vitamins you take with your health care professional. What side effects may I notice from receiving this medicine? Side effects that you should report to your doctor or health care professional as soon as possible: - allergic reactions like skin rash, itching or hives, swelling of the face, lips, or tongue - dark urine - fever - joint pain - muscle cramps or pain - redness, blistering, peeling or loosening of the skin, including inside the mouth - trouble passing urine or change in the amount of urine - unusually weak or tired - yellowing of the eyes or skin Side effects that usually do not require medical attention (report to your doctor or health care professional if they continue or are bothersome): - constipation -  heartburn - nausea This list may not describe all possible side effects. Call your doctor for medical advice about side effects. You may report side effects to FDA at 1-800-FDA-1088. Where should I keep my medicine? Keep out of the reach of children. Store at room temperature between 15 and 30 degrees C (59 and 86 degrees F). Protect from light. Throw away any unused medicine after the expiration date. NOTE: This sheet is a summary. It may not cover all possible information. If you have questions about this medicine, talk to your doctor, pharmacist, or health care provider.  2019 Elsevier/Gold Standard (2016-11-14 12:48:36)

## 2018-06-11 NOTE — Progress Notes (Signed)
Cardiology Office Note:    Date:  06/11/2018   ID:  Claire Carlson, DOB [birth year], MRN [id number]  PCP:  Raina Mina., MD  Cardiologist:  Jenean Lindau, MD   Referring MD: Raina Mina., MD    ASSESSMENT:    1. Essential hypertension   2. PVC (premature ventricular contraction)   3. 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 Mixed hyperlipidemia    PLAN:    In order of problems listed above:  1. Primary prevention stressed with the patient.  Importance of compliance with diet and medication stressed and she vocalized understanding.  Her blood pressure is stable.  Diet was discussed for dyslipidemia.  She wants to try another statin before she is willing to give up.  I will start her on pitavastatin 1 mg daily.  She will take coenzyme every 10 also.  She will be seen in follow-up appointment in 6 months she will be back in 6 weeks for liver lipid check.  Diet and exercise was explained and if she has any issues with this medication she will get in touch with [place].   Medication Adjustments/Labs and Tests Ordered: Current medicines are reviewed at length with the patient today.  Concerns regarding medicines are outlined above.  Orders Placed This Encounter  Procedures  . Hepatic function panel  . Lipid Profile   Meds ordered this encounter  Medications  . Pitavastatin Calcium (LIVALO) 1 MG TABS    Sig: Take 1 tablet (1 mg total) by mouth daily.    Dispense:  30 tablet    Refill:  3     No chief complaint on file.    History of Present Illness:    Claire Carlson is a 78 y.o. female.  Patient has history of essential hypertension and dyslipidemia.  She denies any problems at this time and takes care of activities of daily living.  No chest pain orthopnea or PND.  The patient mentions to me that she could not tolerate atorvastatin and had myalgias.  She has had similar issues with rosuvastatin.  Currently she is fine and has had no issues.  She has even taken  co-Q10 with this.  At the time of my evaluation, the patient is alert awake oriented and in no distress.  Past Medical History:  Diagnosis Date  . Anxiety   . Barrett's esophagus   . Cancer ([place]) 02/2017   right breast cancer  . Complication of anesthesia   . Dysrhythmia    PVC's, PAC's  . GERD (gastroesophageal reflux disease)   . Hypertension   . Hypothyroidism   . PONV (postoperative nausea and vomiting)   . PVC (premature ventricular contraction)   . Vertigo     Past Surgical History:  Procedure Laterality Date  . BREAST LUMPECTOMY WITH RADIOACTIVE SEED AND SENTINEL LYMPH NODE BIOPSY Right 04/06/2017   Procedure: RIGHT BREAST LUMPECTOMY WITH RADIOACTIVE SEED X 2 AND SENTINEL LYMPH NODE BIOPSY;  Surgeon: Excell Seltzer, MD;  Location: [facility];  Service: General;  Laterality: Right;  . CESAREAN SECTION    . CHOLECYSTECTOMY    . EYE SURGERY     cataracts  . MOUTH SURGERY    . TONSILLECTOMY      Current Medications: Current Meds  Medication Sig  . anastrozole (ARIMIDEX) 1 MG tablet Take 1 mg by mouth daily.  Marland Kitchen atenolol (TENORMIN) 25 MG tablet Take 12.5 mg by mouth daily.  . Calcium Carbonate-Vitamin D (CALTRATE 600+D) 600-400 MG-UNIT tablet Take  1 tablet by mouth daily.  . Cholecalciferol (VITAMIN D-1000 MAX ST) 1000 units tablet Take 2,000 Units by mouth daily.   Marland Kitchen levothyroxine (SYNTHROID, LEVOTHROID) 88 MCG tablet Take 88 mcg by mouth daily.  Marland Kitchen lisinopril-hydrochlorothiazide (PRINZIDE,ZESTORETIC) 20-12.5 MG tablet Take 1 tablet by mouth daily.   . Meclizine HCl 25 MG CHEW Chew 25 mg by mouth daily as needed.  . Multiple Vitamin (MULTIVITAMIN WITH MINERALS) TABS tablet Take 1 tablet by mouth daily.  Marland Kitchen omeprazole (PRILOSEC) 20 MG capsule Take 20 mg by mouth daily.  . Probiotic Product (PROBIOTIC ADVANCED) CAPS Take by mouth.  . vitamin E 200 UNIT capsule Take 200 Units by mouth daily.   . zoledronic acid (RECLAST) 5 MG/100ML SOLN injection Inject 5  mg into the vein.     Allergies:   Penicillin g; Rosuvastatin; and Biaxin [clarithromycin]   Social History   Socioeconomic History  . Marital status: Married    Spouse name: Not on file  . Number of children: Not on file  . Years of education: Not on file  . Highest education level: Not on file  Occupational History  . Not on file  Social Needs  . Financial resource strain: Not on file  . Food insecurity:    Worry: Not on file    Inability: Not on file  . Transportation needs:    Medical: Not on file    Non-medical: Not on file  Tobacco Use  . Smoking status: Former Smoker    Last attempt to quit: 1985    Years since quitting: 35.2  . Smokeless tobacco: Never Used  Substance and Sexual Activity  . Alcohol use: No  . Drug use: No  . Sexual activity: Not on file  Lifestyle  . Physical activity:    Days per week: Not on file    Minutes per session: Not on file  . Stress: Not on file  Relationships  . Social connections:    Talks on phone: Not on file    Gets together: Not on file    Attends religious service: Not on file    Active member of club or organization: Not on file    Attends meetings of clubs or organizations: Not on file    Relationship status: Not on file  Other Topics Concern  . Not on file  Social History Narrative  . Not on file     Family History: The patient's family history includes Heart attack in her father.  ROS:   Please see the history of present illness.    All other systems reviewed and are negative.  EKGs/Labs/Other Studies Reviewed:    The following studies were reviewed today: I discussed my findings with the patient at extensive length.   Recent Labs: 05/16/2018: ALT 16; BUN 17; Creatinine, Ser 1.12; Potassium 5.1; Sodium 141  Recent Lipid Panel    Component Value Date/Time   CHOL 219 (H) 05/16/2018 0831   TRIG 182 (H) 05/16/2018 0831   HDL 49 05/16/2018 0831   CHOLHDL 4.5 (H) 05/16/2018 0831   LDLCALC 134 (H)  05/16/2018 0831    Physical Exam:    VS:  BP 126/78 (BP Location: Right Arm, Patient Position: Sitting, Cuff Size: Normal)   Pulse (!) 52   Ht 5\' 5"  (1.651 m)   Wt 169 lb (76.7 kg)   SpO2 97%   BMI 28.12 kg/m     Wt Readings from Last 3 Encounters:  06/11/18 169 lb (76.7 kg)  05/15/18 171  lb (77.6 kg)  10/15/17 174 lb (78.9 kg)     GEN: Patient is in no acute distress HEENT: Normal NECK: No JVD; No carotid bruits LYMPHATICS: No lymphadenopathy CARDIAC: Hear sounds regular, 2/6 systolic murmur at the apex. RESPIRATORY:  Clear to auscultation without rales, wheezing or rhonchi  ABDOMEN: Soft, non-tender, non-distended MUSCULOSKELETAL:  No edema; No deformity  SKIN: Warm and dry NEUROLOGIC:  Alert and oriented x 3 PSYCHIATRIC:  Normal affect   Signed, Jenean Lindau, MD  06/11/2018 10:40 AM    [place]

## 2018-06-14 ENCOUNTER — Telehealth: Payer: Self-pay

## 2018-06-14 NOTE — Telephone Encounter (Signed)
PA for livalo submitted to covermymeds

## 2018-06-18 ENCOUNTER — Other Ambulatory Visit: Payer: Self-pay

## 2018-06-18 MED ORDER — PRAVASTATIN SODIUM 10 MG PO TABS: 10.0000 mg | ORAL_TABLET | Freq: Every day | ORAL | 3 refills | Status: DC

## 2018-06-18 NOTE — Telephone Encounter (Signed)
Pharmacy request for livalo is denied by insurance. Patient must try lovastatins, pravastatin or simvastatin . Dr. Docia Furl reviewed and approved patient changing from pitavastatin to pravastatin 10 mg. Patient informed and will pick up replacement script today.

## 2018-07-31 DIAGNOSIS — M79672 Pain in left foot: Secondary | ICD-10-CM | POA: Insufficient documentation

## 2018-07-31 DIAGNOSIS — L603 Nail dystrophy: Secondary | ICD-10-CM

## 2018-07-31 DIAGNOSIS — L84 Corns and callosities: Secondary | ICD-10-CM

## 2018-07-31 DIAGNOSIS — M2041 Other hammer toe(s) (acquired), right foot: Secondary | ICD-10-CM

## 2018-07-31 DIAGNOSIS — M2042 Other hammer toe(s) (acquired), left foot: Secondary | ICD-10-CM | POA: Insufficient documentation

## 2018-07-31 HISTORY — DX: Corns and callosities: L84

## 2018-07-31 HISTORY — DX: Pain in left foot: M79.672

## 2018-07-31 HISTORY — DX: Other hammer toe(s) (acquired), right foot: M20.41

## 2018-07-31 HISTORY — DX: Nail dystrophy: L60.3

## 2018-07-31 LAB — HEPATIC FUNCTION PANEL
ALBUMIN: 4.7 g/dL (ref 3.7–4.7)
ALT: 19 IU/L (ref 0–32)
AST: 24 IU/L (ref 0–40)
Alkaline Phosphatase: 50 IU/L (ref 39–117)
BILIRUBIN, DIRECT: 0.12 mg/dL (ref 0.00–0.40)
Bilirubin Total: 0.4 mg/dL (ref 0.0–1.2)
Total Protein: 6.9 g/dL (ref 6.0–8.5)

## 2018-07-31 LAB — LIPID PANEL
CHOL/HDL RATIO: 3.3 ratio (ref 0.0–4.4)
Cholesterol, Total: 181 mg/dL (ref 100–199)
HDL: 55 mg/dL (ref >39)
LDL CALC: 95 mg/dL (ref 0–99)
Triglycerides: 155 mg/dL — ABNORMAL HIGH (ref 0–149)
VLDL CHOLESTEROL CAL: 31 mg/dL (ref 5–40)

## 2018-08-05 ENCOUNTER — Telehealth: Payer: Self-pay | Admitting: *Deleted

## 2018-08-05 NOTE — Telephone Encounter (Signed)
Pt called for lab results. Let her know that the cholesterol medication is making a difference and to continue on it. Gave her the numbers for her cholesterol, HDL, LDL and Triglycerides and let her know they were all improved.

## 2018-08-08 ENCOUNTER — Telehealth: Payer: Self-pay

## 2018-08-08 NOTE — Telephone Encounter (Signed)
-----   Message from Jenean Lindau, MD sent at 08/01/2018  8:03 AM EDT ----- The results of the study is unremarkable. Please inform patient. I will discuss in detail at next appointment. Cc  primary care/referring physician Jenean Lindau, MD 08/01/2018 8:02 AM

## 2018-08-08 NOTE — Telephone Encounter (Signed)
Information relayed to patient, no further questions at this time. Copy of results sent to Dr. Bea Graff per Dr.RRR request.

## 2018-08-12 ENCOUNTER — Telehealth: Payer: Self-pay | Admitting: Cardiology

## 2018-08-12 NOTE — Telephone Encounter (Signed)
Please call patient to discuss what she thinks are side effects she is experiencing from one of the meds RRR has put her on.  She asked for you specifically  She can be reached be reached at [phone]

## 2018-08-12 NOTE — Telephone Encounter (Signed)
Patient states she in on the 3rd bottle of pravastatin and she is having weakness in arms/legs and some pain for the past 2 weeks. She has read that this is a common side from this medication. She experienced cramping with rosuvastatin and atorvastatin. Patient would like to know if she should continue medication or if there is an alternative?

## 2018-08-13 NOTE — Telephone Encounter (Signed)
She can stop her pravastatin for a week and let us know how she feels.  We can give her samples of Livalo 2 mg tablet half tablet daily and liver lipid check in 6 weeks.  Whether to continue statins or not is her choice.

## 2018-08-13 NOTE — Telephone Encounter (Signed)
Pt will stop Pravastatin and call us in 1 week. She says there is no point in starting the Livalo because her insurance doesn't cover it.

## 2018-08-30 DIAGNOSIS — C50911 Malignant neoplasm of unspecified site of right female breast: Secondary | ICD-10-CM

## 2018-08-30 DIAGNOSIS — Z170 Estrogen receptor positive status [ER+]: Secondary | ICD-10-CM

## 2018-12-12 ENCOUNTER — Ambulatory Visit: Payer: Medicare Other | Admitting: Cardiology

## 2018-12-26 ENCOUNTER — Encounter: Payer: Self-pay | Admitting: Cardiology

## 2018-12-26 ENCOUNTER — Other Ambulatory Visit: Payer: Self-pay

## 2018-12-26 ENCOUNTER — Ambulatory Visit (INDEPENDENT_AMBULATORY_CARE_PROVIDER_SITE_OTHER): Payer: Medicare Other | Admitting: Cardiology

## 2018-12-26 VITALS — BP 140/80 | HR 53 | Ht 65.0 in | Wt 174.0 lb

## 2018-12-26 DIAGNOSIS — I10 Essential (primary) hypertension: Secondary | ICD-10-CM | POA: Diagnosis not present

## 2018-12-26 DIAGNOSIS — I493 Ventricular premature depolarization: Secondary | ICD-10-CM | POA: Diagnosis not present

## 2018-12-26 DIAGNOSIS — I472 Ventricular tachycardia: Secondary | ICD-10-CM

## 2018-12-26 DIAGNOSIS — E782 Mixed hyperlipidemia: Secondary | ICD-10-CM | POA: Diagnosis not present

## 2018-12-26 DIAGNOSIS — I4729 Other ventricular tachycardia: Secondary | ICD-10-CM

## 2018-12-26 NOTE — Progress Notes (Signed)
Cardiology Office Note:    Date:  12/26/2018   ID:  Claire Carlson, DOB 10-02-1940, MRN [id number]  PCP:  Raina Mina., MD  Cardiologist:  Jenean Lindau, MD   Referring MD: Raina Mina., MD    ASSESSMENT:    1. PVC (premature ventricular contraction)   2. 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 Essential hypertension   4. Mixed hyperlipidemia    PLAN:    In order of problems listed above:  1. Essential hypertension: Blood pressure stable.  She is much more relaxed now that her husband is doing better.  She is prescribed Zoloft by her primary care physician and she is handling it well.  Also she is now walking some on a regular basis. 2. Mixed dyslipidemia: Diet was discussed.  She plans to have blood work done at her primary care physician's office in November and send me a copy. 3. Patient will be seen in follow-up appointment in 4 months or earlier if the patient has any concerns    Medication Adjustments/Labs and Tests Ordered: Current medicines are reviewed at length with the patient today.  Concerns regarding medicines are outlined above.  No orders of the defined types were placed in this encounter.  No orders of the defined types were placed in this encounter.    Chief Complaint  Patient presents with  . Follow-up     History of Present Illness:    Claire Carlson is a 78 y.o. female.  She has past medical history of essential hypertension, mixed dyslipidemia and nonsustained ventricular tachycardia.  She denies any problems at this time and takes care of activities of daily living.  No chest pain orthopnea or PND.  At the time of my evaluation, the patient is alert awake oriented and in no distress.  Past Medical History:  Diagnosis Date  . Anxiety   . Barrett's esophagus   . Cancer ([place]) 02/2017   right breast cancer  . Complication of anesthesia   . Dysrhythmia    PVC's, PAC's  . GERD (gastroesophageal reflux disease)   .  Hypertension   . Hypothyroidism   . PONV (postoperative nausea and vomiting)   . PVC (premature ventricular contraction)   . Vertigo     Past Surgical History:  Procedure Laterality Date  . BREAST LUMPECTOMY WITH RADIOACTIVE SEED AND SENTINEL LYMPH NODE BIOPSY Right 04/06/2017   Procedure: RIGHT BREAST LUMPECTOMY WITH RADIOACTIVE SEED X 2 AND SENTINEL LYMPH NODE BIOPSY;  Surgeon: Excell Seltzer, MD;  Location: [facility];  Service: General;  Laterality: Right;  . CESAREAN SECTION    . CHOLECYSTECTOMY    . EYE SURGERY     cataracts  . MOUTH SURGERY    . TONSILLECTOMY      Current Medications: Current Meds  Medication Sig  . anastrozole (ARIMIDEX) 1 MG tablet Take 1 mg by mouth daily.  Marland Kitchen atenolol (TENORMIN) 25 MG tablet Take 12.5 mg by mouth daily.  . Calcium Carbonate-Vitamin D (CALTRATE 600+D) 600-400 MG-UNIT tablet Take 1 tablet by mouth daily.  . Cholecalciferol (VITAMIN D-1000 MAX ST) 1000 units tablet Take 2,000 Units by mouth daily.   . hydrochlorothiazide (HYDRODIURIL) 25 MG tablet Take 25 mg by mouth daily.  Marland Kitchen levothyroxine (SYNTHROID, LEVOTHROID) 88 MCG tablet Take 88 mcg by mouth daily.  . Meclizine HCl 25 MG CHEW Chew 25 mg by mouth daily as needed.  . Multiple Vitamin (MULTIVITAMIN WITH MINERALS) TABS tablet Take 1 tablet by mouth daily.  Marland Kitchen  omeprazole (PRILOSEC) 20 MG capsule Take 20 mg by mouth daily.  . sertraline (ZOLOFT) 50 MG tablet 25 mg daily.  . vitamin E 200 UNIT capsule Take 200 Units by mouth daily.   . zoledronic acid (RECLAST) 5 MG/100ML SOLN injection Inject 5 mg into the vein.     Allergies:   Penicillin g, Rosuvastatin, and Biaxin [clarithromycin]   Social History   Socioeconomic History  . Marital status: Married    Spouse name: Not on file  . Number of children: Not on file  . Years of education: Not on file  . Highest education level: Not on file  Occupational History  . Not on file  Social Needs  . Financial resource  strain: Not on file  . Food insecurity    Worry: Not on file    Inability: Not on file  . Transportation needs    Medical: Not on file    Non-medical: Not on file  Tobacco Use  . Smoking status: Former Smoker    Quit date: 1985    Years since quitting: 35.7  . Smokeless tobacco: Never Used  Substance and Sexual Activity  . Alcohol use: No  . Drug use: No  . Sexual activity: Not on file  Lifestyle  . Physical activity    Days per week: Not on file    Minutes per session: Not on file  . Stress: Not on file  Relationships  . Social Herbalist on phone: Not on file    Gets together: Not on file    Attends religious service: Not on file    Active member of club or organization: Not on file    Attends meetings of clubs or organizations: Not on file    Relationship status: Not on file  Other Topics Concern  . Not on file  Social History Narrative  . Not on file     Family History: The patient's family history includes Heart attack in her father.  ROS:   Please see the history of present illness.    All other systems reviewed and are negative.  EKGs/Labs/Other Studies Reviewed:    The following studies were reviewed today: Lipids were discussed with the patient at length.   Recent Labs: 05/16/2018: BUN 17; Creatinine, Ser 1.12; Potassium 5.1; Sodium 141 07/31/2018: ALT 19  Recent Lipid Panel    Component Value Date/Time   CHOL 181 07/31/2018 0928   TRIG 155 (H) 07/31/2018 0928   HDL 55 07/31/2018 0928   CHOLHDL 3.3 07/31/2018 0928   LDLCALC 95 07/31/2018 0928    Physical Exam:    VS:  BP 140/80 (BP Location: Left Arm, Patient Position: Sitting, Cuff Size: Normal)   Pulse (!) 53   Ht 5\' 5"  (1.651 m)   Wt 174 lb (78.9 kg)   SpO2 93%   BMI 28.96 kg/m     Wt Readings from Last 3 Encounters:  12/26/18 174 lb (78.9 kg)  06/11/18 169 lb (76.7 kg)  05/15/18 171 lb (77.6 kg)     GEN: Patient is in no acute distress HEENT: Normal NECK: No JVD; No  carotid bruits LYMPHATICS: No lymphadenopathy CARDIAC: Hear sounds regular, 2/6 systolic murmur at the apex. RESPIRATORY:  Clear to auscultation without rales, wheezing or rhonchi  ABDOMEN: Soft, non-tender, non-distended MUSCULOSKELETAL:  No edema; No deformity  SKIN: Warm and dry NEUROLOGIC:  Alert and oriented x 3 PSYCHIATRIC:  Normal affect   Signed, Jenean Lindau, MD  12/26/2018 10:35  AM    [place]

## 2018-12-26 NOTE — Patient Instructions (Addendum)

## 2019-02-11 DIAGNOSIS — Z79811 Long term (current) use of aromatase inhibitors: Secondary | ICD-10-CM

## 2019-02-11 DIAGNOSIS — Z170 Estrogen receptor positive status [ER+]: Secondary | ICD-10-CM

## 2019-02-11 DIAGNOSIS — M8580 Other specified disorders of bone density and structure, unspecified site: Secondary | ICD-10-CM | POA: Diagnosis not present

## 2019-02-11 DIAGNOSIS — C50911 Malignant neoplasm of unspecified site of right female breast: Secondary | ICD-10-CM

## 2019-03-17 ENCOUNTER — Telehealth: Payer: Self-pay | Admitting: Cardiology

## 2019-03-17 NOTE — Telephone Encounter (Signed)
Patient is asking for a call  back 

## 2019-04-28 ENCOUNTER — Encounter: Payer: Self-pay | Admitting: Cardiology

## 2019-04-28 ENCOUNTER — Other Ambulatory Visit: Payer: Self-pay

## 2019-04-28 ENCOUNTER — Ambulatory Visit (INDEPENDENT_AMBULATORY_CARE_PROVIDER_SITE_OTHER): Payer: Medicare Other | Admitting: Cardiology

## 2019-04-28 VITALS — BP 132/84 | HR 66 | Ht 65.0 in | Wt 175.0 lb

## 2019-04-28 DIAGNOSIS — I472 Ventricular tachycardia: Secondary | ICD-10-CM | POA: Diagnosis not present

## 2019-04-28 DIAGNOSIS — E782 Mixed hyperlipidemia: Secondary | ICD-10-CM

## 2019-04-28 DIAGNOSIS — I10 Essential (primary) hypertension: Secondary | ICD-10-CM

## 2019-04-28 DIAGNOSIS — I4729 Other ventricular tachycardia: Secondary | ICD-10-CM

## 2019-04-28 NOTE — Progress Notes (Signed)
Cardiology Office Note:    Date:  04/28/2019   ID:  Claire Carlson, DOB 1941/01/22, MRN [id number]  PCP:  Raina Mina., MD  Cardiologist:  Jenean Lindau, MD   Referring MD: Raina Mina., MD    ASSESSMENT:    1. Essential hypertension   2. Mixed hyperlipidemia   3. Nonsustained ventricular tachycardia (HCC)    PLAN:    In order of problems listed above:  1. Primary prevention stressed with the patient.  Importance of compliance with diet and medication stressed and she vocalized understanding.  I told her to be active and walking on a regular basis. 2. Essential hypertension: Blood pressure is stable.  Patient is taking appropriate measures with diet 3. Mixed dyslipidemia: Diet was discussed lipids followed by primary care physician and she will be seen in follow-up appointment in 6 months or earlier if she has any concerns.  Lab work from primary care was reviewed.  I discussed my findings with her.   Medication Adjustments/Labs and Tests Ordered: Current medicines are reviewed at length with the patient today.  Concerns regarding medicines are outlined above.  No orders of the defined types were placed in this encounter.  No orders of the defined types were placed in this encounter.    No chief complaint on file.    History of Present Illness:    Claire Carlson is a 79 y.o. female.  Patient has past medical history of essential hypertension and dyslipidemia.  She denies any problems at this time and takes care of activities of daily living.  No chest pain orthopnea or PND.  No palpitations dizziness or any syncopal spells.  At the time of my evaluation, the patient is alert awake oriented and in no distress.  Past Medical History:  Diagnosis Date  . Anxiety   . Barrett's esophagus   . Cancer ([place]) 02/2017   right breast cancer  . Complication of anesthesia   . Dysrhythmia    PVC's, PAC's  . GERD (gastroesophageal reflux disease)   . Hypertension   .  Hypothyroidism   . PONV (postoperative nausea and vomiting)   . PVC (premature ventricular contraction)   . Vertigo     Past Surgical History:  Procedure Laterality Date  . BREAST LUMPECTOMY WITH RADIOACTIVE SEED AND SENTINEL LYMPH NODE BIOPSY Right 04/06/2017   Procedure: RIGHT BREAST LUMPECTOMY WITH RADIOACTIVE SEED X 2 AND SENTINEL LYMPH NODE BIOPSY;  Surgeon: Excell Seltzer, MD;  Location: [place];  Service: General;  Laterality: Right;  . CESAREAN SECTION    . CHOLECYSTECTOMY    . EYE SURGERY     cataracts  . MOUTH SURGERY    . TONSILLECTOMY      Current Medications: Current Meds  Medication Sig  . anastrozole (ARIMIDEX) 1 MG tablet Take 1 mg by mouth daily.  Marland Kitchen atenolol (TENORMIN) 25 MG tablet Take 12.5 mg by mouth daily.  . Calcium Carbonate-Vitamin D (CALTRATE 600+D) 600-400 MG-UNIT tablet Take 1 tablet by mouth daily.  . Cholecalciferol (VITAMIN D-1000 MAX ST) 1000 units tablet Take 2,000 Units by mouth daily.   . hydrochlorothiazide (HYDRODIURIL) 25 MG tablet Take 25 mg by mouth daily.  Marland Kitchen levothyroxine (SYNTHROID, LEVOTHROID) 88 MCG tablet Take 88 mcg by mouth daily.  . Meclizine HCl 25 MG CHEW Chew 25 mg by mouth daily as needed.  . Multiple Vitamin (MULTIVITAMIN WITH MINERALS) TABS tablet Take 1 tablet by mouth daily.  Marland Kitchen omeprazole (PRILOSEC) 20 MG capsule Take 20  mg by mouth daily.  . vitamin E 200 UNIT capsule Take 200 Units by mouth daily.   . zoledronic acid (RECLAST) 5 MG/100ML SOLN injection Inject 5 mg into the vein.     Allergies:   Penicillin g, Rosuvastatin, and Biaxin [clarithromycin]   Social History   Socioeconomic History  . Marital status: Married    Spouse name: Not on file  . Number of children: Not on file  . Years of education: Not on file  . Highest education level: Not on file  Occupational History  . Not on file  Tobacco Use  . Smoking status: Former Smoker    Quit date: 1985    Years since quitting: 36.1  .  Smokeless tobacco: Never Used  Substance and Sexual Activity  . Alcohol use: No  . Drug use: No  . Sexual activity: Not on file  Other Topics Concern  . Not on file  Social History Narrative  . Not on file   Social Determinants of Health   Financial Resource Strain:   . Difficulty of Paying Living Expenses: Not on file  Food Insecurity:   . Worried About Charity fundraiser in the Last Year: Not on file  . Ran Out of Food in the Last Year: Not on file  Transportation Needs:   . Lack of Transportation (Medical): Not on file  . Lack of Transportation (Non-Medical): Not on file  Physical Activity:   . Days of Exercise per Week: Not on file  . Minutes of Exercise per Session: Not on file  Stress:   . Feeling of Stress : Not on file  Social Connections:   . Frequency of Communication with Friends and Family: Not on file  . Frequency of Social Gatherings with Friends and Family: Not on file  . Attends Religious Services: Not on file  . Active Member of Clubs or Organizations: Not on file  . Attends Archivist Meetings: Not on file  . Marital Status: Not on file     Family History: The patient's family history includes Heart attack in her father.  ROS:   Please see the history of present illness.    All other systems reviewed and are negative.  EKGs/Labs/Other Studies Reviewed:    The following studies were reviewed today: As discussed and mentioned above   Recent Labs: 05/16/2018: BUN 17; Creatinine, Ser 1.12; Potassium 5.1; Sodium 141 07/31/2018: ALT 19  Recent Lipid Panel    Component Value Date/Time   CHOL 181 07/31/2018 0928   TRIG 155 (H) 07/31/2018 0928   HDL 55 07/31/2018 0928   CHOLHDL 3.3 07/31/2018 0928   LDLCALC 95 07/31/2018 0928    Physical Exam:    VS:  BP 132/84   Pulse 66   Ht 5\' 5"  (1.651 m)   Wt 175 lb (79.4 kg)   SpO2 94%   BMI 29.12 kg/m     Wt Readings from Last 3 Encounters:  04/28/19 175 lb (79.4 kg)  12/26/18 174 lb  (78.9 kg)  06/11/18 169 lb (76.7 kg)     GEN: Patient is in no acute distress HEENT: Normal NECK: No JVD; No carotid bruits LYMPHATICS: No lymphadenopathy CARDIAC: Hear sounds regular, 2/6 systolic murmur at the apex. RESPIRATORY:  Clear to auscultation without rales, wheezing or rhonchi  ABDOMEN: Soft, non-tender, non-distended MUSCULOSKELETAL:  No edema; No deformity  SKIN: Warm and dry NEUROLOGIC:  Alert and oriented x 3 PSYCHIATRIC:  Normal affect   Signed, Reita Cliche  Nahara Dona, MD  04/28/2019 3:39 PM    [facility]

## 2019-04-28 NOTE — Patient Instructions (Signed)
Medication Instructions:  No medication changes *If you need a refill on your cardiac medications before your next appointment, please call your pharmacy*  Lab Work: None ordered If you have labs (blood work) drawn today and your tests are completely normal, you will receive your results only by: . MyChart Message (if you have MyChart) OR . A paper copy in the mail If you have any lab test that is abnormal or we need to change your treatment, we will call you to review the results.  Testing/Procedures: None ordered  Follow-Up: At [facility], you and your health needs are our priority.  As part of our continuing mission to provide you with exceptional heart care, we have created designated Provider Care Teams.  These Care Teams include your primary Cardiologist (physician) and Advanced Practice Providers (APPs -  Physician Assistants and Nurse Practitioners) who all work together to provide you with the care you need, when you need it.  Your next appointment:   6 month(s)  The format for your next appointment:   In Person  Provider:   Rajan Revankar, MD  Other Instructions NA 

## 2019-06-02 DIAGNOSIS — C50411 Malignant neoplasm of upper-outer quadrant of right female breast: Secondary | ICD-10-CM | POA: Diagnosis not present

## 2019-06-02 DIAGNOSIS — M8589 Other specified disorders of bone density and structure, multiple sites: Secondary | ICD-10-CM

## 2019-08-29 DIAGNOSIS — M8589 Other specified disorders of bone density and structure, multiple sites: Secondary | ICD-10-CM | POA: Diagnosis not present

## 2019-08-29 DIAGNOSIS — C50411 Malignant neoplasm of upper-outer quadrant of right female breast: Secondary | ICD-10-CM | POA: Diagnosis not present

## 2019-11-03 DIAGNOSIS — M5136 Other intervertebral disc degeneration, lumbar region: Secondary | ICD-10-CM

## 2019-11-03 DIAGNOSIS — M51369 Other intervertebral disc degeneration, lumbar region without mention of lumbar back pain or lower extremity pain: Secondary | ICD-10-CM

## 2019-11-03 DIAGNOSIS — I700 Atherosclerosis of aorta: Secondary | ICD-10-CM

## 2019-11-03 HISTORY — DX: Other intervertebral disc degeneration, lumbar region without mention of lumbar back pain or lower extremity pain: M51.369

## 2019-11-03 HISTORY — DX: Atherosclerosis of aorta: I70.0

## 2019-11-03 HISTORY — DX: Other intervertebral disc degeneration, lumbar region: M51.36

## 2019-11-06 ENCOUNTER — Encounter: Payer: Self-pay | Admitting: Cardiology

## 2019-11-06 ENCOUNTER — Ambulatory Visit (INDEPENDENT_AMBULATORY_CARE_PROVIDER_SITE_OTHER): Payer: Medicare Other | Admitting: Cardiology

## 2019-11-06 ENCOUNTER — Other Ambulatory Visit: Payer: Self-pay

## 2019-11-06 VITALS — BP 148/82 | HR 60 | Ht 64.5 in | Wt 175.2 lb

## 2019-11-06 DIAGNOSIS — I10 Essential (primary) hypertension: Secondary | ICD-10-CM | POA: Diagnosis not present

## 2019-11-06 DIAGNOSIS — I472 Ventricular tachycardia: Secondary | ICD-10-CM | POA: Diagnosis not present

## 2019-11-06 DIAGNOSIS — I4729 Other ventricular tachycardia: Secondary | ICD-10-CM

## 2019-11-06 DIAGNOSIS — E782 Mixed hyperlipidemia: Secondary | ICD-10-CM | POA: Diagnosis not present

## 2019-11-06 NOTE — Progress Notes (Signed)
Cardiology Office Note:    Date:  11/06/2019   ID:  Claire Carlson, DOB Mar 04, 1941, MRN [id number]  PCP:  Raina Mina., MD  Cardiologist:  Jenean Lindau, MD   Referring MD: Raina Mina., MD    ASSESSMENT:    1. Essential hypertension   2. Mixed hyperlipidemia   3. Nonsustained ventricular tachycardia (HCC)    PLAN:    In order of problems listed above:  1. Primary prevention stressed with the patient.  Importance of compliance with diet medication stressed she vocalized understanding. 2. Essential hypertension: Blood pressure stable.  Diet was emphasized.  Importance of regular walking at least for another day 5 days a week was emphasized and she promises to do so. 3. Mixed dyslipidemia: Lipids were reviewed diet was explained.  Questions were answered to her satisfaction. 4. Patient will be seen in follow-up appointment in 6 months or earlier if the patient has any concerns    Medication Adjustments/Labs and Tests Ordered: Current medicines are reviewed at length with the patient today.  Concerns regarding medicines are outlined above.  No orders of the defined types were placed in this encounter.  No orders of the defined types were placed in this encounter.    No chief complaint on file.    History of Present Illness:    Claire Carlson is a 78 y.o. female.  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  She denies any problems at this time and takes care of activities of daily living.  No chest pain orthopnea or PND.  At the time of my evaluation, the patient is alert awake oriented and in no distress.  Past Medical History:  Diagnosis Date  . Anxiety   . Barrett's esophagus   . Cancer ([place]) 02/2017   right breast cancer  . Complication of anesthesia   . Dysrhythmia    PVC's, PAC's  . GERD (gastroesophageal reflux disease)   . Hypertension   .  Hypothyroidism   . PONV (postoperative nausea and vomiting)   . PVC (premature ventricular contraction)   . Vertigo     Past Surgical History:  Procedure Laterality Date  . BREAST LUMPECTOMY WITH RADIOACTIVE SEED AND SENTINEL LYMPH NODE BIOPSY Right 04/06/2017   Procedure: RIGHT BREAST LUMPECTOMY WITH RADIOACTIVE SEED X 2 AND SENTINEL LYMPH NODE BIOPSY;  Surgeon: Excell Seltzer, MD;  Location: [place];  Service: General;  Laterality: Right;  . CESAREAN SECTION    . CHOLECYSTECTOMY    . EYE SURGERY     cataracts  . MOUTH SURGERY    . TONSILLECTOMY      Current Medications: Current Meds  Medication Sig  . anastrozole (ARIMIDEX) 1 MG tablet Take 1 mg by mouth daily.  Marland Kitchen atenolol (TENORMIN) 25 MG tablet Take 12.5 mg by mouth daily.  . Calcium Carbonate-Vitamin D (CALTRATE 600+D) 600-400 MG-UNIT tablet Take 1 tablet by mouth daily.  . Cholecalciferol (VITAMIN D-1000 MAX ST) 1000 units tablet Take 2,000 Units by mouth daily.   . hydrochlorothiazide (HYDRODIURIL) 25 MG tablet Take 25 mg by mouth daily.  Marland Kitchen levothyroxine (SYNTHROID, LEVOTHROID) 88 MCG tablet Take 88 mcg by mouth daily.  . Meclizine HCl 25 MG CHEW Chew 25 mg by mouth daily as needed.  . Multiple Vitamin (MULTIVITAMIN WITH MINERALS) TABS tablet Take 1 tablet by mouth daily.  Marland Kitchen omeprazole (PRILOSEC) 20 MG capsule Take 20 mg by mouth daily.  Marland Kitchen  vitamin E 200 UNIT capsule Take 200 Units by mouth daily.   . zoledronic acid (RECLAST) 5 MG/100ML SOLN injection Inject 5 mg into the vein.     Allergies:   Penicillin g, Rosuvastatin, and Biaxin [clarithromycin]   Social History   Socioeconomic History  . Marital status: Married    Spouse name: Not on file  . Number of children: Not on file  . Years of education: Not on file  . Highest education level: Not on file  Occupational History  . Not on file  Tobacco Use  . Smoking status: Former Smoker    Quit date: 1985    Years since quitting: 36.6  .  Smokeless tobacco: Never Used  Vaping Use  . Vaping Use: Never used  Substance and Sexual Activity  . Alcohol use: No  . Drug use: No  . Sexual activity: Not on file  Other Topics Concern  . Not on file  Social History Narrative  . Not on file   Social Determinants of Health   Financial Resource Strain:   . Difficulty of Paying Living Expenses:   Food Insecurity:   . Worried About Charity fundraiser in the Last Year:   . Arboriculturist in the Last Year:   Transportation Needs:   . Film/video editor (Medical):   Marland Kitchen Lack of Transportation (Non-Medical):   Physical Activity:   . Days of Exercise per Week:   . Minutes of Exercise per Session:   Stress:   . Feeling of Stress :   Social Connections:   . Frequency of Communication with Friends and Family:   . Frequency of Social Gatherings with Friends and Family:   . Attends Religious Services:   . Active Member of Clubs or Organizations:   . Attends Archivist Meetings:   Marland Kitchen Marital Status:      Family History: The patient's family history includes Heart attack in her father.  ROS:   Please see the history of present illness.    All other systems reviewed and are negative.  EKGs/Labs/Other Studies Reviewed:    The following studies were reviewed today: EKG reveals sinus rhythm and nonspecific ST-T changes   Recent Labs: No results found for requested labs within last 8760 hours.  Recent Lipid Panel    Component Value Date/Time   CHOL 181 07/31/2018 0928   TRIG 155 (H) 07/31/2018 0928   HDL 55 07/31/2018 0928   CHOLHDL 3.3 07/31/2018 0928   LDLCALC 95 07/31/2018 0928    Physical Exam:    VS:  BP (!) 148/82   Pulse 60   Ht 5' 4.5" (1.638 m)   Wt 175 lb 3.2 oz (79.5 kg)   SpO2 97%   BMI 29.61 kg/m     Wt Readings from Last 3 Encounters:  11/06/19 175 lb 3.2 oz (79.5 kg)  04/28/19 175 lb (79.4 kg)  12/26/18 174 lb (78.9 kg)     GEN: Patient is in no acute distress HEENT:  Normal NECK: No JVD; No carotid bruits LYMPHATICS: No lymphadenopathy CARDIAC: Hear sounds regular, 2/6 systolic murmur at the apex. RESPIRATORY:  Clear to auscultation without rales, wheezing or rhonchi  ABDOMEN: Soft, non-tender, non-distended MUSCULOSKELETAL:  No edema; No deformity  SKIN: Warm and dry NEUROLOGIC:  Alert and oriented x 3 PSYCHIATRIC:  Normal affect   Signed, Jenean Lindau, MD  11/06/2019 11:23 AM    [place]

## 2019-11-06 NOTE — Patient Instructions (Signed)

## 2020-01-01 DIAGNOSIS — M8589 Other specified disorders of bone density and structure, multiple sites: Secondary | ICD-10-CM | POA: Diagnosis not present

## 2020-01-01 DIAGNOSIS — C50411 Malignant neoplasm of upper-outer quadrant of right female breast: Secondary | ICD-10-CM | POA: Diagnosis not present

## 2020-01-27 ENCOUNTER — Other Ambulatory Visit: Payer: Self-pay | Admitting: Hematology and Oncology

## 2020-01-27 ENCOUNTER — Telehealth: Payer: Self-pay | Admitting: Oncology

## 2020-01-27 MED ORDER — ANASTROZOLE 1 MG PO TABS: 1.0000 mg | ORAL_TABLET | Freq: Every day | ORAL | 3 refills | Status: DC

## 2020-01-27 NOTE — Telephone Encounter (Signed)
Patient called concerning Anastrazole Medication being denied through [place].  Check with Denyse Amass and she stated that would go through [place].  Butch Penny stated she had not contacted patient regarding medication.  Patient stated she would call [facility] back and I checked with Melissa and she stated [facility] ordered and she would check into it further

## 2020-01-27 NOTE — Progress Notes (Unsigned)
an

## 2020-02-05 DIAGNOSIS — M2550 Pain in unspecified joint: Secondary | ICD-10-CM

## 2020-02-05 HISTORY — DX: Pain in unspecified joint: M25.50

## 2020-03-08 ENCOUNTER — Telehealth: Payer: Self-pay | Admitting: Cardiology

## 2020-03-08 NOTE — Telephone Encounter (Signed)
Called and pt wanted to see when staff is in the office.

## 2020-03-08 NOTE — Telephone Encounter (Signed)
Patient calling to speak with [place]. She states it is personal and did not want to give any further information.

## 2020-03-15 NOTE — Progress Notes (Signed)
Patient called in regarding her Anastrozole. Patients medication will be going up in cost next year. I called and spoke with the pharmacy helpdesk for her insurance. We completed a PA for a Tier exception. REF# [id number].

## 2020-03-16 HISTORY — DX: Hypercalcemia: E83.52

## 2020-03-18 NOTE — Progress Notes (Signed)
Received a fax from [facility], they denied pateints teir exception for Anastrozole. They stated that this drug was a tier 1 and already at the lowest tier possible under her prescription drug benefit. I called and informed patient.

## 2020-03-29 ENCOUNTER — Encounter: Payer: Self-pay | Admitting: Oncology

## 2020-04-16 ENCOUNTER — Telehealth: Payer: Self-pay | Admitting: Cardiology

## 2020-04-16 NOTE — Telephone Encounter (Signed)
FYI Pt states that she noticed she was in afib with normal rate. Pt request an appt before 2/14. Appt for 1/27 in [place]. Pt will go to the ED if rate increases, shortness of breath or chest pain. Pt verbalized understanding and had no additional questions.

## 2020-04-16 NOTE — Telephone Encounter (Signed)
New message:     Patient would like to speak with a nurse concering her medication.

## 2020-04-20 DIAGNOSIS — R112 Nausea with vomiting, unspecified: Secondary | ICD-10-CM | POA: Insufficient documentation

## 2020-04-20 DIAGNOSIS — I499 Cardiac arrhythmia, unspecified: Secondary | ICD-10-CM | POA: Insufficient documentation

## 2020-04-20 DIAGNOSIS — R42 Dizziness and giddiness: Secondary | ICD-10-CM | POA: Insufficient documentation

## 2020-04-20 DIAGNOSIS — T8859XA Other complications of anesthesia, initial encounter: Secondary | ICD-10-CM | POA: Insufficient documentation

## 2020-04-20 DIAGNOSIS — F419 Anxiety disorder, unspecified: Secondary | ICD-10-CM | POA: Insufficient documentation

## 2020-04-20 DIAGNOSIS — K2270 Barrett's esophagus without dysplasia: Secondary | ICD-10-CM | POA: Insufficient documentation

## 2020-04-20 DIAGNOSIS — E039 Hypothyroidism, unspecified: Secondary | ICD-10-CM | POA: Insufficient documentation

## 2020-04-20 DIAGNOSIS — I10 Essential (primary) hypertension: Secondary | ICD-10-CM | POA: Insufficient documentation

## 2020-04-21 ENCOUNTER — Telehealth: Payer: Self-pay

## 2020-04-21 ENCOUNTER — Ambulatory Visit (INDEPENDENT_AMBULATORY_CARE_PROVIDER_SITE_OTHER): Payer: Medicare Other

## 2020-04-21 ENCOUNTER — Encounter: Payer: Self-pay | Admitting: Cardiology

## 2020-04-21 ENCOUNTER — Other Ambulatory Visit: Payer: Self-pay

## 2020-04-21 ENCOUNTER — Ambulatory Visit (INDEPENDENT_AMBULATORY_CARE_PROVIDER_SITE_OTHER): Payer: Medicare Other | Admitting: Cardiology

## 2020-04-21 VITALS — BP 148/82 | HR 65 | Ht 65.0 in | Wt 170.1 lb

## 2020-04-21 DIAGNOSIS — I700 Atherosclerosis of aorta: Secondary | ICD-10-CM | POA: Diagnosis not present

## 2020-04-21 DIAGNOSIS — E782 Mixed hyperlipidemia: Secondary | ICD-10-CM

## 2020-04-21 DIAGNOSIS — I10 Essential (primary) hypertension: Secondary | ICD-10-CM | POA: Diagnosis not present

## 2020-04-21 DIAGNOSIS — I4729 Other ventricular tachycardia: Secondary | ICD-10-CM

## 2020-04-21 DIAGNOSIS — I472 Ventricular tachycardia: Secondary | ICD-10-CM

## 2020-04-21 MED ORDER — PITAVASTATIN CALCIUM 1 MG PO TABS: 1.0000 mg | ORAL_TABLET | Freq: Every day | ORAL | 3 refills | Status: DC

## 2020-04-21 NOTE — Telephone Encounter (Signed)
Prior Auth for [place] completed and sent to [facility] through Cover my meds. Confirmation [id number]

## 2020-04-21 NOTE — Patient Instructions (Addendum)
Medication Instructions:  Your physician has recommended you make the following change in your medication:   Start Livalo 1 mg daily. Take CoQ 10 200 mg daily. Continue the Magnesium. Notify us if you begin having cramps in your legs as before.   *If you need a refill on your cardiac medications before your next appointment, please call your pharmacy*   Lab Work: None ordered If you have labs (blood work) drawn today and your tests are completely normal, you will receive your results only by: Marland Kitchen MyChart Message (if you have MyChart) OR . A paper copy in the mail If you have any lab test that is abnormal or we need to change your treatment, we will call you to review the results.   Testing/Procedures:  WHY IS MY DOCTOR PRESCRIBING ZIO? The Zio system is proven and trusted by physicians to detect and diagnose irregular heart rhythms -- and has been prescribed to hundreds of thousands of patients.  The FDA has cleared the Zio system to monitor for many different kinds of irregular heart rhythms. In a study, physicians were able to reach a diagnosis 90% of the time with the Zio system1.  You can wear the Zio monitor -- a small, discreet, comfortable patch -- during your normal day-to-day activity, including while you sleep, shower, and exercise, while it records every single heartbeat for analysis.  1Barrett, P., et al. Comparison of 24 Hour Holter Monitoring Versus 14 Day Novel Adhesive Patch Electrocardiographic Monitoring. [place], 2014.  ZIO VS. HOLTER MONITORING The Zio monitor can be comfortably worn for up to 14 days. Holter monitors can be worn for 24 to 48 hours, limiting the time to record any irregular heart rhythms you may have. Zio is able to capture data for the 51% of patients who have their first symptom-triggered arrhythmia after 48 hours.1  LIVE WITHOUT RESTRICTIONS The Zio ambulatory cardiac monitor is a small, unobtrusive, and water-resistant  patch--you might even forget you're wearing it. The Zio monitor records and stores every beat of your heart, whether you're sleeping, working out, or showering. Wear the monitor for 2 weeks, remove 05/05/20.   Follow-Up: At [facility], you and your health needs are our priority.  As part of our continuing mission to provide you with exceptional heart care, we have created designated Provider Care Teams.  These Care Teams include your primary Cardiologist (physician) and Advanced Practice Providers (APPs -  Physician Assistants and Nurse Practitioners) who all work together to provide you with the care you need, when you need it.  We recommend signing up for the patient portal called "MyChart".  Sign up information is provided on this After Visit Summary.  MyChart is used to connect with patients for Virtual Visits (Telemedicine).  Patients are able to view lab/test results, encounter notes, upcoming appointments, etc.  Non-urgent messages can be sent to your provider as well.   To learn more about what you can do with MyChart, go to [email or web address].    Your next appointment:   1 month(s)  The format for your next appointment:   In Person  Provider:   Jyl Heinz, MD   Other Instructions Pitavastatin oral tablets What is this medicine? PITAVASTATIN (pit A va STAT in) is known as a HMG-CoA reductase inhibitor or 'statin'. It lowers the level of cholesterol and triglycerides in the blood. Diet and lifestyle changes are often used with this drug. This medicine may be used for other purposes; ask your health care provider  or pharmacist if you have questions. COMMON BRAND NAME(S): Livalo, Zypitamag What should I tell my health care provider before I take this medicine? They need to know if you have any of these conditions:  diabetes  if you often drink alcohol  history of stroke  kidney disease  liver disease  muscle aches or weakness  thyroid disease  an unusual or  allergic reaction to pitavastatin, other medicines, foods, dyes, or preservatives  pregnant or trying to get pregnant  breast-feeding How should I use this medicine? Take this medicine by mouth with a glass of water. Follow the directions on the prescription label. You can take it with or without food. If it upsets your stomach, take it with food. Take your medicine at regular intervals. Do not take it more often than directed. Do not stop taking except on your doctor's advice. Talk to your pediatrician regarding the use of this medicine in children. While this drug may be prescribed for children as young as 8 years for selected conditions, precautions do apply. Overdosage: If you think you have taken too much of this medicine contact a poison control center or emergency room at once. NOTE: This medicine is only for you. Do not share this medicine with others. What if I miss a dose? If you miss a dose, take it as soon as you can. If it is almost time for your next dose, take only that dose. Do not take double or extra doses. What may interact with this medicine? Do not take this medicine with any of the following medications:  cyclosporine  gemfibrozil  herbal medicines like red yeast rice This medicine may also interact with the following medications:  alcohol  antiviral medicines for HIV or AIDS  erythromycin  other medicines for cholesterol  rifampin  warfarin This list may not describe all possible interactions. Give your health care provider a list of all the medicines, herbs, non-prescription drugs, or dietary supplements you use. Also tell them if you smoke, drink alcohol, or use illegal drugs. Some items may interact with your medicine. What should I watch for while using this medicine? Visit your doctor or health care professional for regular check-ups. You may need regular tests to make sure your liver is working properly. Your health care professional may tell you to  stop taking this medicine if you develop muscle problems. If your muscle problems do not go away after stopping this medicine, contact your health care professional. Do not become pregnant while taking this medicine. Women should inform their health care professional if they wish to become pregnant or think they might be pregnant. There is a potential for serious side effects to an unborn child. Talk to your health care professional or pharmacist for more information. Do not breast-feed an infant while taking this medicine. This medicine may increase blood sugar. Ask your healthcare provider if changes in diet or medicines are needed if you have diabetes. If you are going to need surgery or other procedure, tell your doctor that you are using this medicine. This drug is only part of a total heart-health program. Your doctor or a dietician can suggest a low-cholesterol and low-fat diet to help. Avoid alcohol and smoking, and keep a proper exercise schedule. This medicine may cause a decrease in Co-Enzyme Q-10. You should make sure that you get enough Co-Enzyme Q-10 while you are taking this medicine. Discuss the foods you eat and the vitamins you take with your health care professional. What side effects  may I notice from receiving this medicine? Side effects that you should report to your doctor or health care professional as soon as possible:  allergic reactions like skin rash, itching or hives, swelling of the face, lips, or tongue  confusion  joint pain  loss of memory  redness, blistering, peeling or loosening of the skin, including inside the mouth  signs and symptoms of high blood sugar such as being more thirsty or hungry or having to urinate more than normal. You may also feel very tired or have blurry vision.  signs and symptoms of muscle injury like dark urine; trouble passing urine or change in the amount of urine; unusually weak or tired; muscle pain or side or back pain  yellowing  of the eyes or skin Side effects that usually do not require medical attention (report to your doctor or health care professional if they continue or are bothersome):  constipation  diarrhea  dizziness  gas  headache  nausea  stomach pain  trouble sleeping  upset stomach This list may not describe all possible side effects. Call your doctor for medical advice about side effects. You may report side effects to FDA at 1-800-FDA-1088. Where should I keep my medicine? Keep out of the reach of children. Store at room temperature between 15 and 30 degrees C (59 and 86 degrees F). Protect from light. Throw away any unused medicine after the expiration date. NOTE: This sheet is a summary. It may not cover all possible information. If you have questions about this medicine, talk to your doctor, pharmacist, or health care provider.  2021 Elsevier/Gold Standard (2018-01-03 08:15:37)

## 2020-04-21 NOTE — Progress Notes (Signed)
Cardiology Office Note:    Date:  04/21/2020   ID:  Claire Carlson, DOB Jan 06, 1941, MRN [id number]  PCP:  Raina Mina., MD  Cardiologist:  Jenean Lindau, MD   Referring MD: Raina Mina., MD    ASSESSMENT:    1. Aortic atherosclerosis ([place])   2. Essential hypertension   3. Mixed hyperlipidemia   4. Nonsustained ventricular tachycardia ([facility])    PLAN:    In order of problems listed above:  1. Aortic atherosclerosis: Secondary prevention stressed with the patient.  Importance of compliance with diet medication stressed and she vocalized understanding.  Importance of regular exercise stressed and she promises to do so. 2. Essential hypertension: Blood pressure is stable and diet was emphasized. 3. Palpitations: I reviewed blood work.  Her TSH is normal.  Her Kardia app reveals sinus rhythm with PACs.  I would hesitate to increase her beta-blocker because of bradycardia.  She has not tolerated 25 mg of atenolol in the past she has felt dizzy and lightheaded.  Somewhat relieved with the same.  I will do a 2-week monitoring to assess the rhythm and the symptoms. 4. Mixed dyslipidemia: Markedly elevated lipids and therefore I will initiate her on Livalo 1 mg daily with coenzyme Q 10 supplementation.  I would like to see how she does.  She has not tolerated 3 similar statins in the past.  I told her to stop if it gives her any side effects and call us and she vocalized understanding.  Benefits and potential is explained and she agreed and understood 5. Patient will be seen in follow-up appointment in 1 month or earlier if the patient has any concerns    Medication Adjustments/Labs and Tests Ordered: Current medicines are reviewed at length with the patient today.  Concerns regarding medicines are outlined above.  No orders of the defined types were placed in this encounter.  No orders of the defined types were placed in this encounter.    No chief complaint on file.     History of Present Illness:    Claire Carlson is a 80 y.o. female.  Patient has past medical history of aortic atherosclerosis, essential hypertension and dyslipidemia.  She came in today because she gives history of some palpitations.  She is concerned about the symptoms.  No chest pain orthopnea or PND.  She mentions to me that her cardiac device app on her phone revealed possible atrial fibrillation.  She showed the strips to me and they are in sinus rhythm with PACs.  At the time of my evaluation, the patient is alert awake oriented and in no distress.  Past Medical History:  Diagnosis Date  . Anxiety   . Anxiety disorder 07/14/2015  . Aortic atherosclerosis ([place]) 11/03/2019   Formatting of this note might be different from the original. seen on x-rays at [facility] 09/2019  . Barrett's esophagus   . Breast mass 02/07/2017  . Cancer ([place]) 02/2017   right breast cancer  . Chronic kidney disease, stage III (moderate) ([place]) 06/15/2015  . Complication of anesthesia   . Degenerative lumbar disc 11/03/2019   Formatting of this note might be different from the original. xrays at [facility] 09/2019  . Dysrhythmia    PVC's, PAC's  . Essential hypertension 06/15/2015  . GERD (gastroesophageal reflux disease)   . Hammertoes of both feet 07/31/2018  . High risk medication use 06/15/2015  . Hypercalcemia 03/16/2020  . Hypertension   . Hypothyroidism   . Hypothyroidism (acquired) 07/14/2015  .  Loss of hearing 06/15/2015   Last Assessment & Plan:  Relevant Hx: Course: Daily Update: Today's Plan:with some removal of dry wax and skin and recommendation to use some sweet oil for her ears and softening them up more, she was as well given some flonase to use for her eustachian tube   Electronically signed by: Mayer Camel, NP 06/15/15 2201  Last Assessment & Plan:  Formatting of this note might be different  . Malignant neoplasm of central portion of left breast in female, estrogen receptor negative ([place])  02/07/2017  . Mixed hyperlipidemia 06/15/2015  . 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 Nonsustained ventricular tachycardia ([place]) 03/14/2017  . Obesity (BMI 30-39.9) 07/14/2015  . Osteopenia 06/15/2015   Formatting of this note might be different from the original. Uses reclast.  . Pain of left foot 07/31/2018  . Pedal edema 10/15/2017  . Polyarthralgia 02/05/2020  . PONV (postoperative nausea and vomiting)   . Post-menopausal 05/03/2017  . Pre-ulcerative calluses 07/31/2018  . Prediabetes 06/15/2015  . PVC (premature ventricular contraction)   . Stage 3a chronic kidney disease ([place]) 06/15/2015  . Vertigo   . Vitamin D deficiency 06/15/2015    Past Surgical History:  Procedure Laterality Date  . BREAST LUMPECTOMY WITH RADIOACTIVE SEED AND SENTINEL LYMPH NODE BIOPSY Right 04/06/2017   Procedure: RIGHT BREAST LUMPECTOMY WITH RADIOACTIVE SEED X 2 AND SENTINEL LYMPH NODE BIOPSY;  Surgeon: Excell Seltzer, MD;  Location: [facility];  Service: General;  Laterality: Right;  . CESAREAN SECTION    . CHOLECYSTECTOMY    . EYE SURGERY     cataracts  . MOUTH SURGERY    . TONSILLECTOMY      Current Medications: Current Meds  Medication Sig  . anastrozole (ARIMIDEX) 1 MG tablet Take 1 tablet (1 mg total) by mouth daily.  Marland Kitchen atenolol (TENORMIN) 25 MG tablet Take 12.5 mg by mouth daily.  . Calcium Carbonate-Vitamin D 600-400 MG-UNIT tablet Take 1 tablet by mouth daily.  . Cholecalciferol 25 MCG (1000 UT) tablet Take 2,000 Units by mouth daily.   Marland Kitchen CINNAMON PO Take 1 tablet by mouth daily.  . hydrochlorothiazide (HYDRODIURIL) 25 MG tablet Take 25 mg by mouth daily.  Marland Kitchen levothyroxine (SYNTHROID, LEVOTHROID) 88 MCG tablet Take 88 mcg by mouth daily.  . Magnesium 250 MG TABS Take 250 mg by mouth daily.  . Meclizine HCl 25 MG CHEW Chew 25 mg by mouth daily as needed.  . Multiple Vitamin (MULTIVITAMIN WITH MINERALS) TABS tablet Take 1 tablet by mouth daily.  Marland Kitchen omeprazole (PRILOSEC) 20 MG capsule Take 20  mg by mouth daily.  . vitamin E 200 UNIT capsule Take 200 Units by mouth daily.      Allergies:   Penicillin g, Rosuvastatin, and Biaxin [clarithromycin]   Social History   Socioeconomic History  . Marital status: Married    Spouse name: Not on file  . Number of children: Not on file  . Years of education: Not on file  . Highest education level: Not on file  Occupational History  . Not on file  Tobacco Use  . Smoking status: Former Smoker    Quit date: 1985    Years since quitting: 37.0  . Smokeless tobacco: Never Used  Vaping Use  . Vaping Use: Never used  Substance and Sexual Activity  . Alcohol use: No  . Drug use: No  . Sexual activity: Not on file  Other Topics Concern  . Not on file  Social History Narrative  .  Not on file   Social Determinants of Health   Financial Resource Strain: Not on file  Food Insecurity: Not on file  Transportation Needs: Not on file  Physical Activity: Not on file  Stress: Not on file  Social Connections: Not on file     Family History: The patient's family history includes Heart attack in her father.  ROS:   Please see the history of present illness.    All other systems reviewed and are negative.  EKGs/Labs/Other Studies Reviewed:    The following studies were reviewed today: EKG reveals sinus rhythm and nonspecific ST-T changes   Recent Labs: No results found for requested labs within last 8760 hours.  Recent Lipid Panel    Component Value Date/Time   CHOL 181 07/31/2018 0928   TRIG 155 (H) 07/31/2018 0928   HDL 55 07/31/2018 0928   CHOLHDL 3.3 07/31/2018 0928   LDLCALC 95 07/31/2018 0928    Physical Exam:    VS:  BP (!) 148/82   Pulse 65   Ht 5\' 5"  (1.651 m)   Wt 170 lb 1.9 oz (77.2 kg)   SpO2 100%   BMI 28.31 kg/m     Wt Readings from Last 3 Encounters:  04/21/20 170 lb 1.9 oz (77.2 kg)  11/06/19 175 lb 3.2 oz (79.5 kg)  04/28/19 175 lb (79.4 kg)     GEN: Patient is in no acute distress HEENT:  Normal NECK: No JVD; No carotid bruits LYMPHATICS: No lymphadenopathy CARDIAC: Hear sounds regular, 2/6 systolic murmur at the apex. RESPIRATORY:  Clear to auscultation without rales, wheezing or rhonchi  ABDOMEN: Soft, non-tender, non-distended MUSCULOSKELETAL:  No edema; No deformity  SKIN: Warm and dry NEUROLOGIC:  Alert and oriented x 3 PSYCHIATRIC:  Normal affect   Signed, Jenean Lindau, MD  04/21/2020 3:40 PM    [facility]

## 2020-04-29 NOTE — Progress Notes (Incomplete)
[facility]  [address] [place],  [place]  [address] [phone]  Clinic Day:  04/29/2020  Referring physician: Raina Mina., MD   This document serves as a record of services personally performed by Hosie Poisson, MD. It was created on their behalf by [employer], a trained medical scribe. The creation of this record is based on the scribe's personal observations and the provider's statements to them.   CHIEF COMPLAINT:  CC: History of stage IA hormone receptor positive right breast cancer  Current Treatment:  Surveillance; Annual IV Reclast for osteopenia   HISTORY OF PRESENT ILLNESS:  Claire Carlson is a 80 y.o. female with a history of stage IA (T1c N0 M0) hormone receptor positive right breast cancer diagnosed in December 2018.  She was treated with lumpectomy in January 2019.  Pathology revealed a 1.7 cm, grade 2-3, invasive ductal carcinoma , as well as high-grade ductal carcinoma in situ with areas of close margin and lymphovascular invasion.  Estrogen and progesterone receptors were positive and her 2 Neu negative.  Ki 67 was 15%.  Three lymph nodes were negative for metastasis.  EndoPredict revealed a score of 4, which is in the high risk category, and corresponds to an 18% risk of recurrence when treated with endocrine therapy alone.  We recommended adjuvant chemotherapy with docetaxel/cyclophosphamide for 4 cycles, which she started in February.  Due to excessive toxicities, she discontinued chemotherapy after her 2nd cycle.  She had a history of osteopenia and had been receiving Reclast every 2 years prior to coming in for her breast cancer treatment.  Bone density scan in February 2019 revealed osteopenia with a T-score of -2 in the femur, which was stable when compared to 2016.  We recommended yearly Reclast due to the risk of worsening bone density associated with anastrozole.  She received Reclast in April when she recovered from  chemotherapy.  She received adjuvant radiation to the right breast completed in in May.  When she was seen in June for routine follow-up, she was placed on adjuvant hormonal therapy with anastrozole 1 mg daily, which she started July 1st 2019.  Bilateral diagnostic mammogram in November 2020 did not reveal any evidence of malignancy.  We reviewed her family history and her sister has had stage I endometrial cancer at age 43, a first cousin had pancreatic cancer at age 26, her mother had lung cancer at age 15 and her maternal grandfather had liver cancer.  Her cousin Jenny Reichmann also had malignancy of unknown type and unknown age.  Bone density scan from March 2021 showed stable osteopenia with a T-score of -2.0 of the left femur.  Dual femur total mean measures -1.1, previously -1.0.  The AP spine is within the normal limits with a T-score of -0.5, previously -0.4.    She presents for follow up. She states she has been feeling well since last visit. She has been following [employer] and has lost 7 pounds. She denies fever, chills, nausea or vomiting. She denies shortness of breath, cough or chest pain. She denies issue with bowel or bladder. Her energy and appetite are good. She continues to follow with her PCP.   INTERVAL HISTORY:  [name] is here for routine follow up ***.   Her  appetite is good, and she has gained/lost _ pounds since her last visit.  She denies fever, chills or other signs of infection.  She denies nausea, vomiting, bowel issues, or abdominal pain.  She denies  sore throat, cough, dyspnea, or chest pain.   REVIEW OF SYSTEMS:  Review of Systems - Oncology   VITALS:  There were no vitals taken for this visit.  Wt Readings from Last 3 Encounters:  04/21/20 170 lb 1.9 oz (77.2 kg)  11/06/19 175 lb 3.2 oz (79.5 kg)  04/28/19 175 lb (79.4 kg)    There is no height or weight on file to calculate BMI.  Performance status (ECOG): {CHL ONC [id number]  PHYSICAL EXAM:  Physical  Exam  LABS:  No flowsheet data found. CMP Latest Ref Rng & Units 07/31/2018 05/16/2018 04/02/2017  Glucose 65 - 99 mg/dL - 93 89  BUN 8 - 27 mg/dL - 17 17  Creatinine 0.57 - 1.00 mg/dL - 1.12(H) 1.15(H)  Sodium 134 - 144 mmol/L - 141 135  Potassium 3.5 - 5.2 mmol/L - 5.1 5.1  Chloride 96 - 106 mmol/L - 100 100(L)  CO2 20 - 29 mmol/L - 26 28  Calcium 8.7 - 10.3 mg/dL - 9.6 9.7  Total Protein 6.0 - 8.5 g/dL 6.9 6.7 -  Total Bilirubin 0.0 - 1.2 mg/dL 0.4 0.4 -  Alkaline Phos 39 - 117 IU/L 50 52 -  AST 0 - 40 IU/L 24 21 -  ALT 0 - 32 IU/L 19 16 -     No results found for: CEA1 / No results found for: CEA1 No results found for: PSA1 No results found for: RWE315 No results found for: CAN125  No results found for: TOTALPROTELP, ALBUMINELP, A1GS, A2GS, BETS, BETA2SER, GAMS, MSPIKE, SPEI No results found for: TIBC, FERRITIN, IRONPCTSAT No results found for: LDH   STUDIES:  No results found.   Allergies:  Allergies  Allergen Reactions  . Penicillin G     Other reaction(s): Other (See Comments) welts  . Rosuvastatin Other (See Comments)  . Biaxin [Clarithromycin] Rash    Current Medications: Current Outpatient Medications  Medication Sig Dispense Refill  . anastrozole (ARIMIDEX) 1 MG tablet Take 1 tablet (1 mg total) by mouth daily. 90 tablet 3  . atenolol (TENORMIN) 25 MG tablet Take 12.5 mg by mouth daily.    . Calcium Carbonate-Vitamin D 600-400 MG-UNIT tablet Take 1 tablet by mouth daily.    . Cholecalciferol 25 MCG (1000 UT) tablet Take 2,000 Units by mouth daily.     Marland Kitchen CINNAMON PO Take 1 tablet by mouth daily.    . hydrochlorothiazide (HYDRODIURIL) 25 MG tablet Take 25 mg by mouth daily.    Marland Kitchen levothyroxine (SYNTHROID, LEVOTHROID) 88 MCG tablet Take 88 mcg by mouth daily.    . Magnesium 250 MG TABS Take 250 mg by mouth daily.    . Meclizine HCl 25 MG CHEW Chew 25 mg by mouth daily as needed.    . Multiple Vitamin (MULTIVITAMIN WITH MINERALS) TABS tablet Take 1 tablet by  mouth daily.    Marland Kitchen omeprazole (PRILOSEC) 20 MG capsule Take 20 mg by mouth daily.    . Pitavastatin Calcium 1 MG TABS Take 1 tablet (1 mg total) by mouth daily. 30 tablet 3  . vitamin E 200 UNIT capsule Take 200 Units by mouth daily.      No current facility-administered medications for this visit.     ASSESSMENT & PLAN:   Assessment:   1. Stage IA hormone receptor positive breast cancer, diagnosed in December 2018, with no evidence of disease.  She was treated with surgery, partial chemotherapy, adjuvant radiation and now hormonal therapy.  She was placed on anastrozole 1  mg daily in July 2019, and will continue this for 5 years.    2. Osteopenia, she receives yearly Reclast which will be administered today.  Last bone density scan remains stable.  She will be due for repeat bone density in March 2023.  3. Barrett's esophagus, followed by Dr. Lyda Jester.  4. Cutaneous reaction to COVID vaccine, reassured.  Plan: She had yearly Reclast in December. We will schedule clinic follow up at the same time.  We will see her back in *** months with CBC and CMP for repeat evaluation.  She verbalizes understanding of and agreement to the plans discussed today. She knows to call the office should any new questions or concerns arise.    I provided *** minutes (2:22 PM - 2:22 PM) of face-to-face time during this this encounter and > 50% was spent counseling as documented under my assessment and plan.    Derwood Kaplan, MD [facility] AT [facility] [address] [place] [address] Dept: [phone] Dept Fax: [phone]   I, Rita Ohara, am acting as scribe for Derwood Kaplan, MD  I have reviewed this report as typed by the medical scribe, and it is complete and accurate.

## 2020-05-03 ENCOUNTER — Inpatient Hospital Stay: Payer: Medicare Other | Attending: Oncology | Admitting: Hematology and Oncology

## 2020-05-03 ENCOUNTER — Telehealth: Payer: Self-pay | Admitting: Hematology and Oncology

## 2020-05-03 ENCOUNTER — Telehealth: Payer: Self-pay | Admitting: Oncology

## 2020-05-03 VITALS — BP 196/80 | HR 56 | Temp 97.9°F | Resp 16 | Ht 65.0 in | Wt 170.2 lb

## 2020-05-03 DIAGNOSIS — C50112 Malignant neoplasm of central portion of left female breast: Secondary | ICD-10-CM | POA: Diagnosis not present

## 2020-05-03 DIAGNOSIS — Z171 Estrogen receptor negative status [ER-]: Secondary | ICD-10-CM

## 2020-05-03 NOTE — Telephone Encounter (Signed)
Per Vida Roller, patient has been rescheduled to see Melissa today 2/7 at 2:00 pm.  Ok per [facility]

## 2020-05-03 NOTE — Telephone Encounter (Signed)
Per 2/7 los next appt sched and given to patient 

## 2020-05-03 NOTE — Progress Notes (Signed)
[facility]  [address] [place],  [place]  [address] [phone]  Clinic Day:  05/03/2020  Referring physician: Raina Mina., MD     CHIEF COMPLAINT:  CC: An 80 year old female with a history of stage IA hormone receptor positive right breast cancer here for six month evaluation  Current Treatment:  Anastrazole 1 mg daily; Annual IV Reclast for osteopenia   HISTORY OF PRESENT ILLNESS:  Claire Carlson is a 81 y.o. female with a history of stage IA (T1c N0 M0) hormone receptor positive right breast cancer diagnosed in December 2018.  She was treated with lumpectomy in January 2019.  Pathology revealed a 1.7 cm, grade 2-3, invasive ductal carcinoma , as well as high-grade ductal carcinoma in situ with areas of close margin and lymphovascular invasion.  Estrogen and progesterone receptors were positive and her 2 Neu negative.  Ki 67 was 15%.  Three lymph nodes were negative for metastasis.  EndoPredict revealed a score of 4, which is in the high risk category, and corresponds to an 18% risk of recurrence when treated with endocrine therapy alone.  We recommended adjuvant chemotherapy with docetaxel/cyclophosphamide for 4 cycles, which she started in February.  Due to excessive toxicities, she discontinued chemotherapy after her 2nd cycle.  She had a history of osteopenia and had been receiving Reclast every 2 years prior to coming in for her breast cancer treatment.  Bone density scan in February 2019 revealed osteopenia with a T-score of -2 in the femur, which was stable when compared to 2016.  We recommended yearly Reclast due to the risk of worsening bone density associated with anastrozole.  She received Reclast in April when she recovered from chemotherapy.  She received adjuvant radiation to the right breast completed in in May.  When she was seen in June for routine follow-up, she was placed on adjuvant hormonal therapy with anastrozole 1 mg daily,  which she started July 1st 2019.  Bilateral diagnostic mammogram in November 2020 did not reveal any evidence of malignancy.  We reviewed her family history and her sister has had stage I endometrial cancer at age 5, a first cousin had pancreatic cancer at age 78, her mother had lung cancer at age 46 and her maternal grandfather had liver cancer.  Her cousin Jenny Reichmann also had malignancy of unknown type and unknown age.  Bone density scan from March 2021 showed stable osteopenia with a T-score of -2.0 of the left femur.  Dual femur total mean measures -1.1, previously -1.0.  The AP spine is within the normal limits with a T-score of -0.5, previously -0.4.     INTERVAL HISTORY:  [name] is here for routine follow up of her hormone receptor positive right breast cancer. She reports being well since last visit. Recent mammogram in December 2021 was negative for any findings concerning for disease. She had her last Reclast infusion in June 2021 and will be due again in June 2023 as she states she now receives these infusions every other year. She will also have a repeat bone denisty exam in 2023. Today she presents with an exacerbation of vertigo, which she states started earlier in the day and is beginning to ease up. She is also wearing a heart monitor that will be removed tomorrow. Her blood pressure is elevated during this visit and she attributes that to her vertigo and states it was normal at her recent physician visit last week.  She denies fever, chills, nausea or  vomiting. She denies shortness of breath, cough or chest pain. She denies issue with bowel or bladder. Her appetite is good and her weight is stable since her last visit.    REVIEW OF SYSTEMS:  Review of Systems  Constitutional: Negative for appetite change, chills, diaphoresis, fatigue, fever and unexpected weight change.  HENT:   Negative for hearing loss, lump/mass, mouth sores, nosebleeds, sore throat, tinnitus, trouble swallowing and voice  change.   Eyes: Negative for eye problems and icterus.  Respiratory: Negative for chest tightness, cough, hemoptysis, shortness of breath and wheezing.   Cardiovascular: Negative for chest pain, leg swelling and palpitations.  Gastrointestinal: Negative for abdominal distention, abdominal pain, blood in stool, constipation, diarrhea, nausea, rectal pain and vomiting.  Endocrine: Negative for hot flashes.  Genitourinary: Negative for bladder incontinence, difficulty urinating, dyspareunia, dysuria, frequency, hematuria and nocturia.   Musculoskeletal: Negative for arthralgias, back pain, flank pain, gait problem, myalgias, neck pain and neck stiffness.  Skin: Negative for itching, rash and wound.  Neurological: Positive for dizziness and light-headedness. Negative for extremity weakness, gait problem, headaches, numbness, seizures and speech difficulty.  Hematological: Negative for adenopathy. Does not bruise/bleed easily.  Psychiatric/Behavioral: Negative for confusion, decreased concentration, depression, sleep disturbance and suicidal ideas. The patient is not nervous/anxious.      VITALS:  Blood pressure (!) 196/80, pulse (!) 56, temperature 97.9 F (36.6 C), temperature source Oral, resp. rate 16, height 5\' 5"  (1.651 m), weight 170 lb 3.2 oz (77.2 kg), SpO2 95 %.  Wt Readings from Last 3 Encounters:  05/03/20 170 lb 3.2 oz (77.2 kg)  04/21/20 170 lb 1.9 oz (77.2 kg)  11/06/19 175 lb 3.2 oz (79.5 kg)    Body mass index is 28.32 kg/m.  Performance status (ECOG): 0 - Asymptomatic  PHYSICAL EXAM:  Physical Exam Constitutional:      General: She is not in acute distress.    Appearance: Normal appearance. She is normal weight. She is not ill-appearing, toxic-appearing or diaphoretic.  HENT:     Head: Normocephalic and atraumatic.     Right Ear: Tympanic membrane normal.     Left Ear: Tympanic membrane normal.     Nose: Nose normal. No congestion or rhinorrhea.     Mouth/Throat:      Mouth: Mucous membranes are moist.     Pharynx: Oropharynx is clear. No oropharyngeal exudate or posterior oropharyngeal erythema.  Eyes:     General: No scleral icterus.       Right eye: No discharge.        Left eye: No discharge.     Extraocular Movements: Extraocular movements intact.     Conjunctiva/sclera: Conjunctivae normal.     Pupils: Pupils are equal, round, and reactive to light.  Neck:     Vascular: No carotid bruit.  Cardiovascular:     Rate and Rhythm: Normal rate and regular rhythm.     Heart sounds: No murmur heard. No friction rub. No gallop.   Pulmonary:     Effort: Pulmonary effort is normal. No respiratory distress.     Breath sounds: Normal breath sounds. No stridor. No wheezing, rhonchi or rales.  Chest:     Chest wall: No mass, lacerations, deformity, swelling, tenderness, crepitus or edema. There is no dullness to percussion.  Breasts: Breasts are symmetrical.     Right: Normal. No swelling, bleeding, inverted nipple, mass, nipple discharge, skin change, tenderness, axillary adenopathy or supraclavicular adenopathy.     Left: Normal. No swelling, bleeding, inverted nipple,  mass, nipple discharge, skin change, tenderness, axillary adenopathy or supraclavicular adenopathy.    Abdominal:     General: Abdomen is flat. Bowel sounds are normal. There is no distension.     Palpations: There is no mass.     Tenderness: There is no abdominal tenderness. There is no right CVA tenderness, left CVA tenderness, guarding or rebound.     Hernia: No hernia is present.  Musculoskeletal:        General: No swelling, tenderness, deformity or signs of injury. Normal range of motion.     Cervical back: Normal range of motion and neck supple. No rigidity or tenderness.     Right lower leg: No edema.     Left lower leg: No edema.  Lymphadenopathy:     Cervical: No cervical adenopathy.     Upper Body:     Right upper body: No supraclavicular, axillary or pectoral adenopathy.      Left upper body: No supraclavicular, axillary or pectoral adenopathy.  Skin:    General: Skin is warm and dry.     Capillary Refill: Capillary refill takes less than 2 seconds.     Coloration: Skin is not jaundiced or pale.     Findings: No bruising, erythema, lesion or rash.  Neurological:     General: No focal deficit present.     Mental Status: She is alert and oriented to person, place, and time. Mental status is at baseline.     Cranial Nerves: No cranial nerve deficit.     Sensory: No sensory deficit.     Motor: No weakness.     Coordination: Coordination normal.     Gait: Gait normal.     Deep Tendon Reflexes: Reflexes normal.  Psychiatric:        Mood and Affect: Mood normal.        Behavior: Behavior normal.        Thought Content: Thought content normal.        Judgment: Judgment normal.     LABS:  No flowsheet data found. CMP Latest Ref Rng & Units 07/31/2018 05/16/2018 04/02/2017  Glucose 65 - 99 mg/dL - 93 89  BUN 8 - 27 mg/dL - 17 17  Creatinine 0.57 - 1.00 mg/dL - 1.12(H) 1.15(H)  Sodium 134 - 144 mmol/L - 141 135  Potassium 3.5 - 5.2 mmol/L - 5.1 5.1  Chloride 96 - 106 mmol/L - 100 100(L)  CO2 20 - 29 mmol/L - 26 28  Calcium 8.7 - 10.3 mg/dL - 9.6 9.7  Total Protein 6.0 - 8.5 g/dL 6.9 6.7 -  Total Bilirubin 0.0 - 1.2 mg/dL 0.4 0.4 -  Alkaline Phos 39 - 117 IU/L 50 52 -  AST 0 - 40 IU/L 24 21 -  ALT 0 - 32 IU/L 19 16 -     No results found for: CEA1 / No results found for: CEA1 No results found for: PSA1 No results found for: VZD638 No results found for: CAN125  No results found for: TOTALPROTELP, ALBUMINELP, A1GS, A2GS, BETS, BETA2SER, GAMS, MSPIKE, SPEI No results found for: TIBC, FERRITIN, IRONPCTSAT No results found for: LDH   STUDIES:  No results found.   Allergies:  Allergies  Allergen Reactions  . Penicillin G     Other reaction(s): Other (See Comments) welts  . Rosuvastatin Other (See Comments)  . Biaxin [Clarithromycin] Rash     Current Medications: Current Outpatient Medications  Medication Sig Dispense Refill  . anastrozole (ARIMIDEX) 1 MG tablet Take  1 tablet (1 mg total) by mouth daily. 90 tablet 3  . atenolol (TENORMIN) 25 MG tablet Take 12.5 mg by mouth daily.    . Calcium Carbonate-Vitamin D 600-400 MG-UNIT tablet Take 1 tablet by mouth daily.    . Cholecalciferol 25 MCG (1000 UT) tablet Take 2,000 Units by mouth daily.     Marland Kitchen CINNAMON PO Take 1 tablet by mouth daily.    . hydrochlorothiazide (HYDRODIURIL) 25 MG tablet Take 25 mg by mouth daily.    Marland Kitchen levothyroxine (SYNTHROID, LEVOTHROID) 88 MCG tablet Take 88 mcg by mouth daily.    . Magnesium 250 MG TABS Take 250 mg by mouth daily.    . Meclizine HCl 25 MG CHEW Chew 25 mg by mouth daily as needed.    . Multiple Vitamin (MULTIVITAMIN WITH MINERALS) TABS tablet Take 1 tablet by mouth daily.    Marland Kitchen omeprazole (PRILOSEC) 20 MG capsule Take 20 mg by mouth daily.    . Pitavastatin Calcium 1 MG TABS Take 1 tablet (1 mg total) by mouth daily. 30 tablet 3  . vitamin E 200 UNIT capsule Take 200 Units by mouth daily.      No current facility-administered medications for this visit.     ASSESSMENT & PLAN:   Assessment:   1. Stage IA hormone receptor positive breast cancer, diagnosed in December 2018, with no evidence of disease.  She was treated with surgery, partial chemotherapy, adjuvant radiation and now hormonal therapy.  She was placed on anastrozole 1 mg daily in July 2019, and will continue this for 5 years.    2. Osteopenia, she receives Reclast every other year and will be due in 2023.  She will be due for repeat bone density in March 2023.  3. Barrett's esophagus, followed by Dr. Lyda Jester.   Plan: She had yearly Reclast in June 2021 and will be due again next year. We will see her back in 6 months with CBC and CMP for repeat evaluation.  She verbalizes understanding of and agreement to the plans discussed today. She knows to call the office should  any new questions or concerns arise.    Melodye Ped, NP [facility] AT [facility] [address] [place] [address] Dept: [phone] Dept Fax: [phone]

## 2020-05-04 DIAGNOSIS — I472 Ventricular tachycardia: Secondary | ICD-10-CM | POA: Diagnosis not present

## 2020-05-06 NOTE — Telephone Encounter (Signed)
PA for Livalo approved from 04-21-20 through 03-26-21

## 2020-05-10 ENCOUNTER — Ambulatory Visit: Payer: Medicare Other | Admitting: Cardiology

## 2020-05-25 ENCOUNTER — Encounter: Payer: Self-pay | Admitting: Cardiology

## 2020-05-25 ENCOUNTER — Other Ambulatory Visit: Payer: Self-pay

## 2020-05-25 ENCOUNTER — Ambulatory Visit (INDEPENDENT_AMBULATORY_CARE_PROVIDER_SITE_OTHER): Payer: Medicare Other | Admitting: Cardiology

## 2020-05-25 VITALS — BP 154/80 | HR 62 | Ht 65.6 in | Wt 171.4 lb

## 2020-05-25 DIAGNOSIS — I700 Atherosclerosis of aorta: Secondary | ICD-10-CM

## 2020-05-25 DIAGNOSIS — E782 Mixed hyperlipidemia: Secondary | ICD-10-CM | POA: Diagnosis not present

## 2020-05-25 DIAGNOSIS — I4729 Other ventricular tachycardia: Secondary | ICD-10-CM

## 2020-05-25 DIAGNOSIS — I10 Essential (primary) hypertension: Secondary | ICD-10-CM

## 2020-05-25 DIAGNOSIS — I472 Ventricular tachycardia: Secondary | ICD-10-CM

## 2020-05-25 NOTE — Progress Notes (Signed)
Cardiology Office Note:    Date:  05/25/2020   ID:  Claire Carlson, DOB 29-Oct-1940, MRN [id number]  PCP:  Raina Mina., MD  Cardiologist:  Jenean Lindau, MD   Referring MD: Raina Mina., MD    ASSESSMENT:    1. Essential hypertension   2. Mixed hyperlipidemia   3. Nonsustained ventricular tachycardia ([place])   4. Aortic atherosclerosis (HCC)    PLAN:    In order of problems listed above:  1. Aortic atherosclerosis: Secondary prevention stressed with the patient.  Importance of compliance with diet medication stressed and she vocalized understanding.  Importance of regular activity stressed and she was advised to walk at least half an hour a day and she tells me that she is going to try to do so. 2. Palpitations: These have resolved and the patient was reassured about the findings of the testing and she is happy. 3. Mixed dyslipidemia: Diet was advised.  Diet regular exercise stressed.  She gets her lipids checked by primary care physician. 4. Essential hypertension: Blood pressure stable.  Lifestyle modification and diet including salt intake issues were discussed.  Questions were answered to her satisfaction. 5. Patient will be seen in follow-up appointment in 6 months or earlier if the patient has any concerns    Medication Adjustments/Labs and Tests Ordered: Current medicines are reviewed at length with the patient today.  Concerns regarding medicines are outlined above.  No orders of the defined types were placed in this encounter.  No orders of the defined types were placed in this encounter.    No chief complaint on file.    History of Present Illness:    Claire Carlson is a 80 y.o. female.  Patient has past medical history of palpitations, aortic atherosclerosis, essential hypertension and dyslipidemia.  She denies any problems at this time and takes care of activities of daily living.  No chest pain orthopnea or PND.  Her palpitations were evaluated and  her event monitor was unremarkable.  At the time of my evaluation, the patient is alert awake oriented and in no distress.  She feels very reassured and happy about it.  Past Medical History:  Diagnosis Date  . Anxiety   . Anxiety disorder 07/14/2015  . Aortic atherosclerosis ([place]) 11/03/2019   Formatting of this note might be different from the original. seen on x-rays at [facility] 09/2019  . Barrett's esophagus   . Breast mass 02/07/2017  . Cancer ([place]) 02/2017   right breast cancer  . Chronic kidney disease, stage III (moderate) ([place]) 06/15/2015  . Complication of anesthesia   . Degenerative lumbar disc 11/03/2019   Formatting of this note might be different from the original. xrays at [facility] 09/2019  . Dysrhythmia    PVC's, PAC's  . Essential hypertension 06/15/2015  . GERD (gastroesophageal reflux disease)   . Hammertoes of both feet 07/31/2018  . High risk medication use 06/15/2015  . Hypercalcemia 03/16/2020  . Hypertension   . Hypothyroidism   . Hypothyroidism (acquired) 07/14/2015  . Loss of hearing 06/15/2015   Last Assessment & Plan:  Relevant Hx: Course: Daily Update: Today's Plan:with some removal of dry wax and skin and recommendation to use some sweet oil for her ears and softening them up more, she was as well given some flonase to use for her eustachian tube   Electronically signed by: Mayer Camel, NP 06/15/15 2201  Last Assessment & Plan:  Formatting of this note might be different  . Malignant  neoplasm of central portion of left breast in female, estrogen receptor negative ([place]) 02/07/2017  . Mixed hyperlipidemia 06/15/2015  . 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 Nonsustained ventricular tachycardia ([place]) 03/14/2017  . Obesity (BMI 30-39.9) 07/14/2015  . Osteopenia 06/15/2015   Formatting of this note might be different from the original. Uses reclast.  . Pain of left foot 07/31/2018  . Pedal edema 10/15/2017  . Polyarthralgia 02/05/2020  . PONV (postoperative nausea and vomiting)    . Post-menopausal 05/03/2017  . Pre-ulcerative calluses 07/31/2018  . Prediabetes 06/15/2015  . PVC (premature ventricular contraction)   . Stage 3a chronic kidney disease ([place]) 06/15/2015  . Vertigo   . Vitamin D deficiency 06/15/2015    Past Surgical History:  Procedure Laterality Date  . BREAST LUMPECTOMY WITH RADIOACTIVE SEED AND SENTINEL LYMPH NODE BIOPSY Right 04/06/2017   Procedure: RIGHT BREAST LUMPECTOMY WITH RADIOACTIVE SEED X 2 AND SENTINEL LYMPH NODE BIOPSY;  Surgeon: Excell Seltzer, MD;  Location: [place];  Service: General;  Laterality: Right;  . CESAREAN SECTION    . CHOLECYSTECTOMY    . EYE SURGERY     cataracts  . MOUTH SURGERY    . TONSILLECTOMY      Current Medications: Current Meds  Medication Sig  . anastrozole (ARIMIDEX) 1 MG tablet Take 1 tablet (1 mg total) by mouth daily.  Marland Kitchen atenolol (TENORMIN) 25 MG tablet Take 12.5 mg by mouth daily.  . Calcium Carbonate-Vitamin D 600-400 MG-UNIT tablet Take 1 tablet by mouth daily.  . Cholecalciferol 25 MCG (1000 UT) tablet Take 2,000 Units by mouth daily.   Marland Kitchen CINNAMON PO Take 1 tablet by mouth daily.  . hydrochlorothiazide (HYDRODIURIL) 25 MG tablet Take 25 mg by mouth daily.  Marland Kitchen levothyroxine (SYNTHROID, LEVOTHROID) 88 MCG tablet Take 88 mcg by mouth daily.  . Magnesium 250 MG TABS Take 250 mg by mouth daily.  . Meclizine HCl 25 MG CHEW Chew 25 mg by mouth daily as needed (dizziness).  . Multiple Vitamin (MULTIVITAMIN WITH MINERALS) TABS tablet Take 1 tablet by mouth daily.  Marland Kitchen omeprazole (PRILOSEC) 20 MG capsule Take 20 mg by mouth daily.  . vitamin E 180 MG (400 UNITS) capsule Take 400 Units by mouth daily.  . vitamin E 200 UNIT capsule Take 200 Units by mouth daily.      Allergies:   Penicillin g, Rosuvastatin, and Biaxin [clarithromycin]   Social History   Socioeconomic History  . Marital status: Married    Spouse name: Not on file  . Number of children: Not on file  . Years of education:  Not on file  . Highest education level: Not on file  Occupational History  . Not on file  Tobacco Use  . Smoking status: Former Smoker    Quit date: 1985    Years since quitting: 37.1  . Smokeless tobacco: Never Used  Vaping Use  . Vaping Use: Never used  Substance and Sexual Activity  . Alcohol use: No  . Drug use: No  . Sexual activity: Not on file  Other Topics Concern  . Not on file  Social History Narrative  . Not on file   Social Determinants of Health   Financial Resource Strain: Not on file  Food Insecurity: Not on file  Transportation Needs: Not on file  Physical Activity: Not on file  Stress: Not on file  Social Connections: Not on file     Family History: The patient's family history includes Heart attack in her father.  ROS:   Please see the history of present illness.    All other systems reviewed and are negative.  EKGs/Labs/Other Studies Reviewed:    The following studies were reviewed today: I discussed my findings with the patient at length.   Recent Labs: No results found for requested labs within last 8760 hours.  Recent Lipid Panel    Component Value Date/Time   CHOL 181 07/31/2018 0928   TRIG 155 (H) 07/31/2018 0928   HDL 55 07/31/2018 0928   CHOLHDL 3.3 07/31/2018 0928   LDLCALC 95 07/31/2018 0928    Physical Exam:    VS:  BP (!) 154/80   Pulse 62   Ht 5' 5.6" (1.666 m)   Wt 171 lb 6.4 oz (77.7 kg)   SpO2 96%   BMI 28.00 kg/m     Wt Readings from Last 3 Encounters:  05/25/20 171 lb 6.4 oz (77.7 kg)  05/03/20 170 lb 3.2 oz (77.2 kg)  04/21/20 170 lb 1.9 oz (77.2 kg)     GEN: Patient is in no acute distress HEENT: Normal NECK: No JVD; No carotid bruits LYMPHATICS: No lymphadenopathy CARDIAC: Hear sounds regular, 2/6 systolic murmur at the apex. RESPIRATORY:  Clear to auscultation without rales, wheezing or rhonchi  ABDOMEN: Soft, non-tender, non-distended MUSCULOSKELETAL:  No edema; No deformity  SKIN: Warm and  dry NEUROLOGIC:  Alert and oriented x 3 PSYCHIATRIC:  Normal affect   Signed, Jenean Lindau, MD  05/25/2020 3:00 PM    [facility]

## 2020-05-25 NOTE — Patient Instructions (Signed)

## 2020-09-08 ENCOUNTER — Other Ambulatory Visit: Payer: Self-pay

## 2020-09-08 ENCOUNTER — Encounter (HOSPITAL_COMMUNITY): Payer: Self-pay | Admitting: Internal Medicine

## 2020-09-08 ENCOUNTER — Observation Stay (HOSPITAL_COMMUNITY): Payer: Medicare Other

## 2020-09-08 ENCOUNTER — Emergency Department (HOSPITAL_COMMUNITY): Payer: Medicare Other

## 2020-09-08 ENCOUNTER — Observation Stay (HOSPITAL_COMMUNITY)
Admission: EM | Admit: 2020-09-08 | Discharge: 2020-09-09 | Disposition: A | Discharge status: Home / Self Care | Payer: Medicare Other | Attending: Internal Medicine | Admitting: Internal Medicine

## 2020-09-08 DIAGNOSIS — F419 Anxiety disorder, unspecified: Secondary | ICD-10-CM | POA: Insufficient documentation

## 2020-09-08 DIAGNOSIS — Z20822 Contact with and (suspected) exposure to covid-19: Secondary | ICD-10-CM | POA: Insufficient documentation

## 2020-09-08 DIAGNOSIS — K219 Gastro-esophageal reflux disease without esophagitis: Secondary | ICD-10-CM | POA: Diagnosis not present

## 2020-09-08 DIAGNOSIS — Y900 Blood alcohol level of less than 20 mg/100 ml: Secondary | ICD-10-CM | POA: Diagnosis not present

## 2020-09-08 DIAGNOSIS — G510 Bell's palsy: Secondary | ICD-10-CM | POA: Diagnosis not present

## 2020-09-08 DIAGNOSIS — Z853 Personal history of malignant neoplasm of breast: Secondary | ICD-10-CM | POA: Insufficient documentation

## 2020-09-08 DIAGNOSIS — Z8249 Family history of ischemic heart disease and other diseases of the circulatory system: Secondary | ICD-10-CM | POA: Insufficient documentation

## 2020-09-08 DIAGNOSIS — C50911 Malignant neoplasm of unspecified site of right female breast: Secondary | ICD-10-CM

## 2020-09-08 DIAGNOSIS — E039 Hypothyroidism, unspecified: Secondary | ICD-10-CM | POA: Diagnosis present

## 2020-09-08 DIAGNOSIS — Z880 Allergy status to penicillin: Secondary | ICD-10-CM | POA: Diagnosis not present

## 2020-09-08 DIAGNOSIS — I10 Essential (primary) hypertension: Secondary | ICD-10-CM | POA: Diagnosis not present

## 2020-09-08 DIAGNOSIS — I6782 Cerebral ischemia: Secondary | ICD-10-CM | POA: Insufficient documentation

## 2020-09-08 DIAGNOSIS — Z79899 Other long term (current) drug therapy: Secondary | ICD-10-CM | POA: Diagnosis not present

## 2020-09-08 DIAGNOSIS — R29810 Facial weakness: Secondary | ICD-10-CM

## 2020-09-08 DIAGNOSIS — Z888 Allergy status to other drugs, medicaments and biological substances status: Secondary | ICD-10-CM | POA: Insufficient documentation

## 2020-09-08 DIAGNOSIS — Z87891 Personal history of nicotine dependence: Secondary | ICD-10-CM | POA: Insufficient documentation

## 2020-09-08 DIAGNOSIS — N1831 Chronic kidney disease, stage 3a: Secondary | ICD-10-CM | POA: Diagnosis not present

## 2020-09-08 DIAGNOSIS — I1310 Hypertensive heart and chronic kidney disease without heart failure, with stage 1 through stage 4 chronic kidney disease, or unspecified chronic kidney disease: Secondary | ICD-10-CM | POA: Insufficient documentation

## 2020-09-08 DIAGNOSIS — I700 Atherosclerosis of aorta: Secondary | ICD-10-CM | POA: Insufficient documentation

## 2020-09-08 DIAGNOSIS — G459 Transient cerebral ischemic attack, unspecified: Secondary | ICD-10-CM

## 2020-09-08 DIAGNOSIS — R7303 Prediabetes: Secondary | ICD-10-CM | POA: Diagnosis not present

## 2020-09-08 DIAGNOSIS — E782 Mixed hyperlipidemia: Secondary | ICD-10-CM | POA: Diagnosis not present

## 2020-09-08 DIAGNOSIS — E785 Hyperlipidemia, unspecified: Secondary | ICD-10-CM | POA: Diagnosis present

## 2020-09-08 HISTORY — DX: Facial weakness: R29.810

## 2020-09-08 HISTORY — DX: Malignant neoplasm of unspecified site of right female breast: C50.911

## 2020-09-08 LAB — URINALYSIS, ROUTINE W REFLEX MICROSCOPIC
Bilirubin Urine: NEGATIVE
Glucose, UA: NEGATIVE mg/dL
Hgb urine dipstick: NEGATIVE
Ketones, ur: NEGATIVE mg/dL
Leukocytes,Ua: NEGATIVE
Nitrite: NEGATIVE
Protein, ur: NEGATIVE mg/dL
Specific Gravity, Urine: 1.009 (ref 1.005–1.030)
pH: 7 (ref 5.0–8.0)

## 2020-09-08 LAB — DIFFERENTIAL
Abs Immature Granulocytes: 0.02 10*3/uL (ref 0.00–0.07)
Basophils Absolute: 0.1 10*3/uL (ref 0.0–0.1)
Basophils Relative: 1 %
Eosinophils Absolute: 0.3 10*3/uL (ref 0.0–0.5)
Eosinophils Relative: 5 %
Immature Granulocytes: 0 %
Lymphocytes Relative: 22 %
Lymphs Abs: 1.6 10*3/uL (ref 0.7–4.0)
Monocytes Absolute: 0.8 10*3/uL (ref 0.1–1.0)
Monocytes Relative: 11 %
Neutro Abs: 4.6 10*3/uL (ref 1.7–7.7)
Neutrophils Relative %: 61 %

## 2020-09-08 LAB — COMPREHENSIVE METABOLIC PANEL
ALT: 27 U/L (ref 0–44)
AST: 30 U/L (ref 15–41)
Albumin: 4.1 g/dL (ref 3.5–5.0)
Alkaline Phosphatase: 46 U/L (ref 38–126)
Anion gap: 9 (ref 5–15)
BUN: 14 mg/dL (ref 8–23)
CO2: 28 mmol/L (ref 22–32)
Calcium: 9.8 mg/dL (ref 8.9–10.3)
Chloride: 97 mmol/L — ABNORMAL LOW (ref 98–111)
Creatinine, Ser: 1.09 mg/dL — ABNORMAL HIGH (ref 0.44–1.00)
GFR, Estimated: 51 mL/min — ABNORMAL LOW (ref >60)
Glucose, Bld: 107 mg/dL — ABNORMAL HIGH (ref 70–99)
Potassium: 3.7 mmol/L (ref 3.5–5.1)
Sodium: 134 mmol/L — ABNORMAL LOW (ref 135–145)
Total Bilirubin: 0.5 mg/dL (ref 0.3–1.2)
Total Protein: 7.6 g/dL (ref 6.5–8.1)

## 2020-09-08 LAB — RAPID URINE DRUG SCREEN, HOSP PERFORMED
Amphetamines: NOT DETECTED
Barbiturates: NOT DETECTED
Benzodiazepines: NOT DETECTED
Cocaine: NOT DETECTED
Opiates: NOT DETECTED
Tetrahydrocannabinol: NOT DETECTED

## 2020-09-08 LAB — I-STAT CHEM 8, ED
BUN: 14 mg/dL (ref 8–23)
Calcium, Ion: 1.23 mmol/L (ref 1.15–1.40)
Chloride: 97 mmol/L — ABNORMAL LOW (ref 98–111)
Creatinine, Ser: 1.1 mg/dL — ABNORMAL HIGH (ref 0.44–1.00)
Glucose, Bld: 104 mg/dL — ABNORMAL HIGH (ref 70–99)
HCT: 42 % (ref 36.0–46.0)
Hemoglobin: 14.3 g/dL (ref 12.0–15.0)
Potassium: 4 mmol/L (ref 3.5–5.1)
Sodium: 136 mmol/L (ref 135–145)
TCO2: 28 mmol/L (ref 22–32)

## 2020-09-08 LAB — CBC
HCT: 43.4 % (ref 36.0–46.0)
Hemoglobin: 14.6 g/dL (ref 12.0–15.0)
MCH: 28.4 pg (ref 26.0–34.0)
MCHC: 33.6 g/dL (ref 30.0–36.0)
MCV: 84.4 fL (ref 80.0–100.0)
Platelets: 290 10*3/uL (ref 150–400)
RBC: 5.14 MIL/uL — ABNORMAL HIGH (ref 3.87–5.11)
RDW: 13.3 % (ref 11.5–15.5)
WBC: 7.4 10*3/uL (ref 4.0–10.5)
nRBC: 0 % (ref 0.0–0.2)

## 2020-09-08 LAB — APTT: aPTT: 36 seconds (ref 24–36)

## 2020-09-08 LAB — RESP PANEL BY RT-PCR (FLU A&B, COVID) ARPGX2
Influenza A by PCR: NEGATIVE
Influenza B by PCR: NEGATIVE
SARS Coronavirus 2 by RT PCR: NEGATIVE

## 2020-09-08 LAB — PROTIME-INR
INR: 1 (ref 0.8–1.2)
Prothrombin Time: 12.9 seconds (ref 11.4–15.2)

## 2020-09-08 LAB — ETHANOL: Alcohol, Ethyl (B): <10 mg/dL (ref <10)

## 2020-09-08 IMAGING — MR MR MRA HEAD W/O CM
2 series · 20 of 48 positions shown · non-contrast
Comparison: No pertinent prior exam.

CLINICAL DATA: Stroke follow-up

EXAM:
MRA HEAD WITHOUT CONTRAST
TECHNIQUE: Angiographic images of the Circle of Willis were acquired using MRA
technique without intravenous contrast.

[Series 2: ax (id) · axial · 1.0mm · 0.43mm/px · z∈[-90,-1]mm · 19 of 193 slices shown]
[im 1/193]
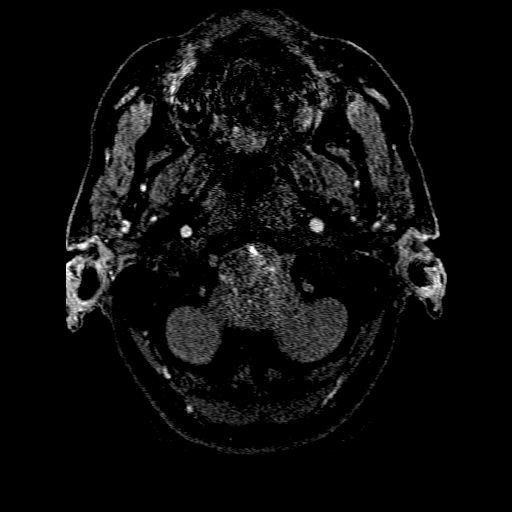
[im 5/193]
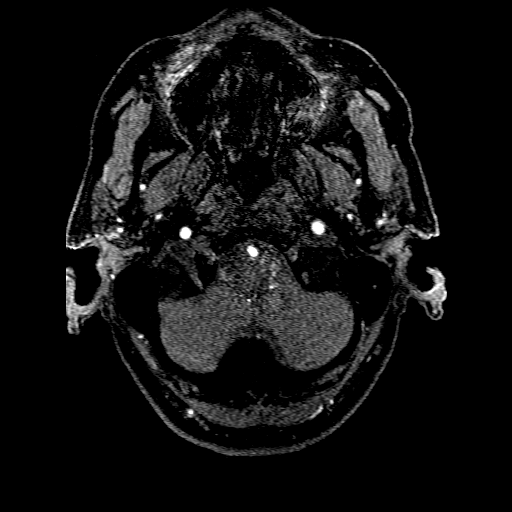
[im 9/193]
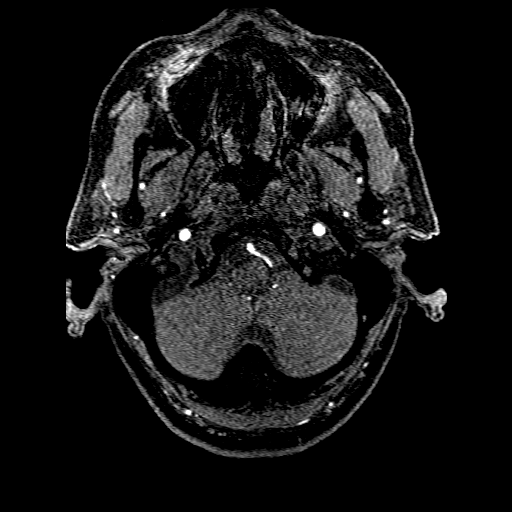
[im 13/193]
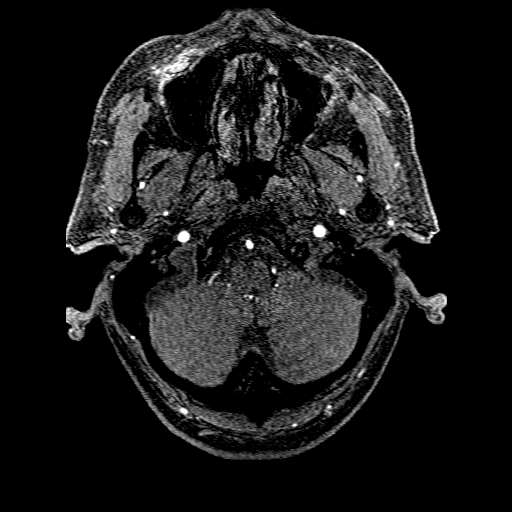
[im 17/193]
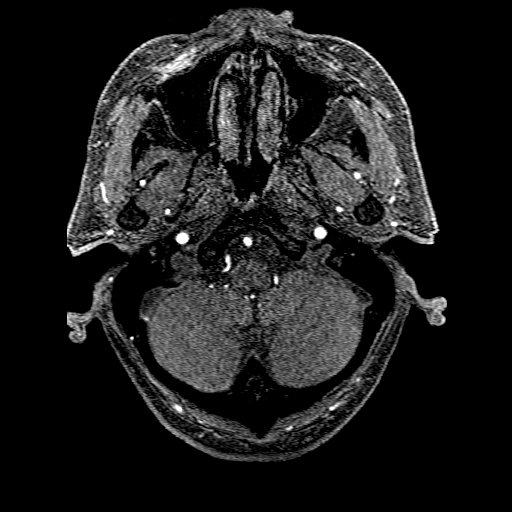
[im 21/193]
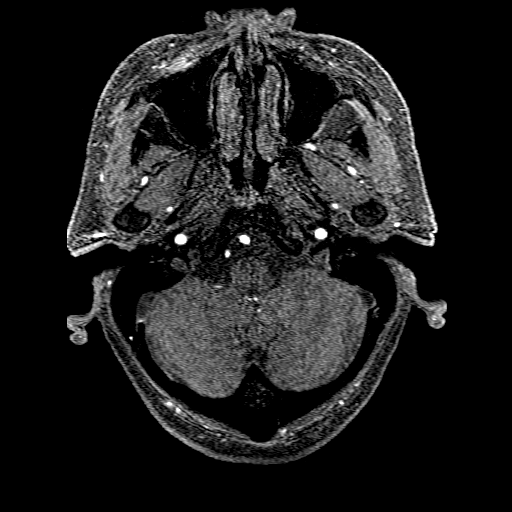
[im 26/193]
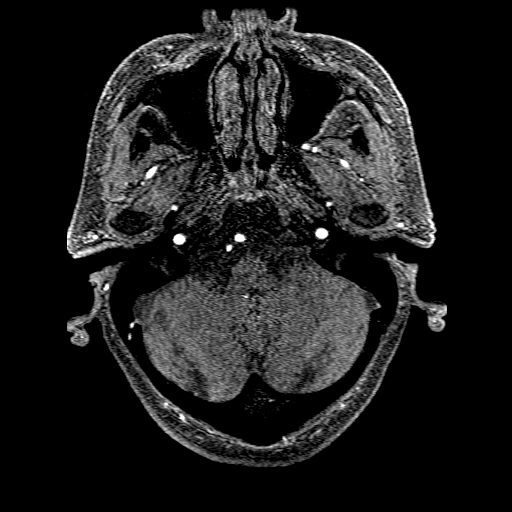
[im 30/193]
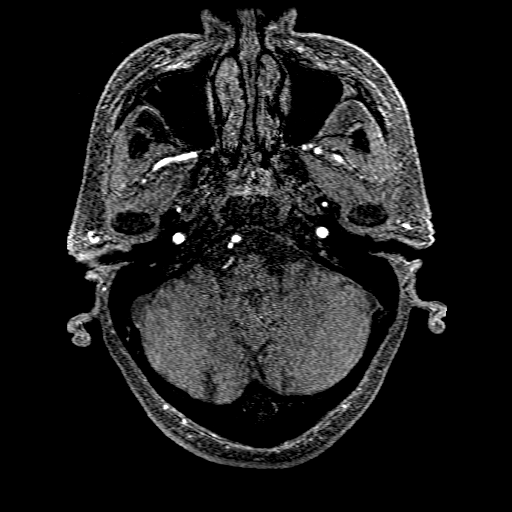
[im 34/193]
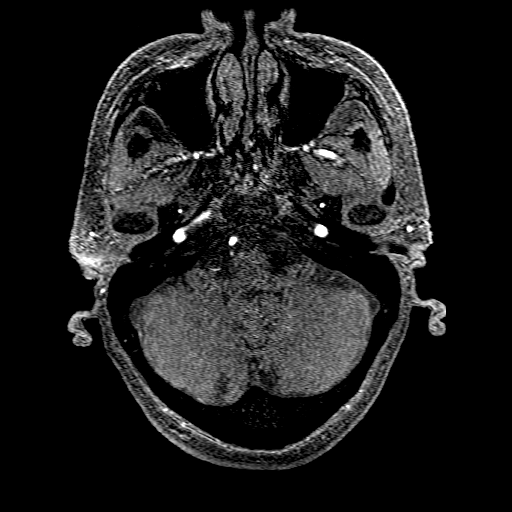
[im 38/193]
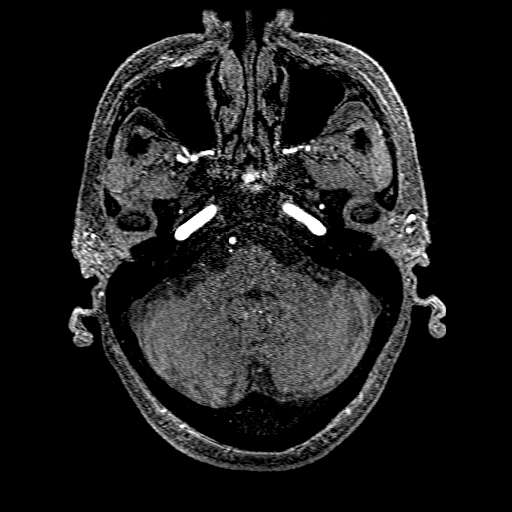
[im 42/193]
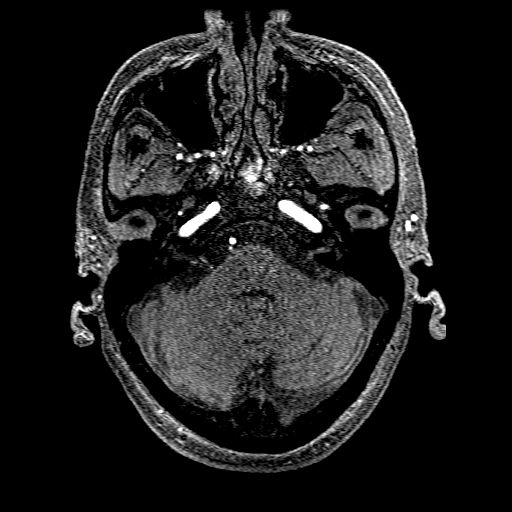
[im 59/193]
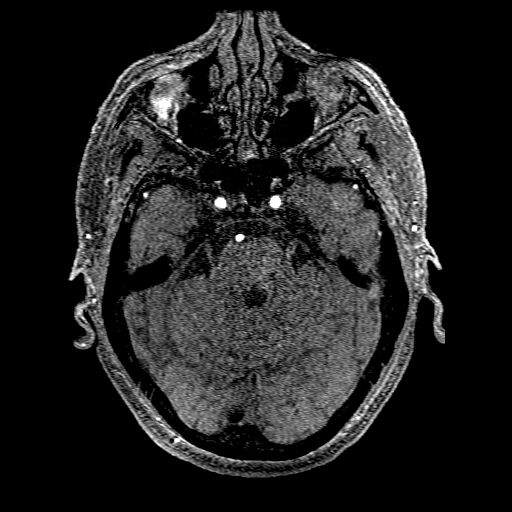
[im 84/193]
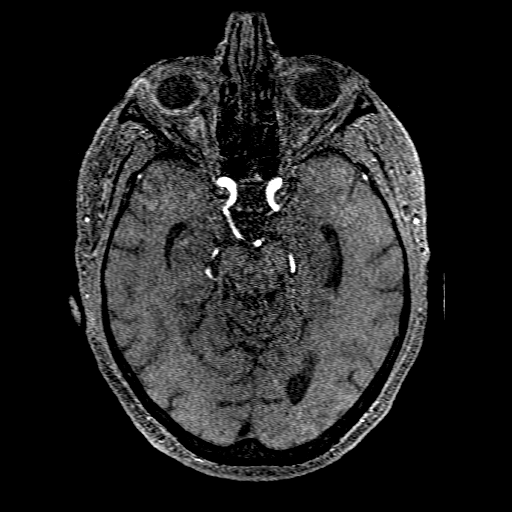
[im 97/193]
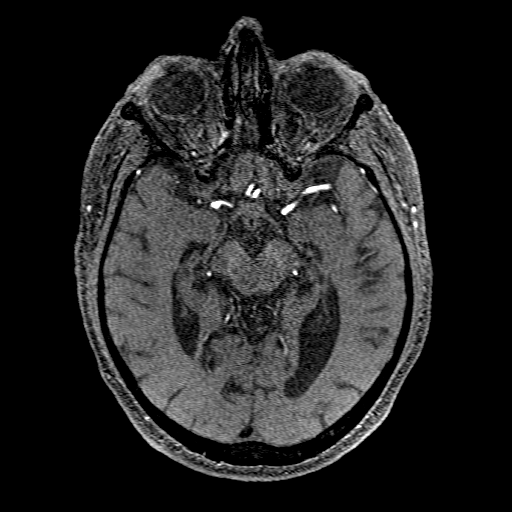
[im 109/193]
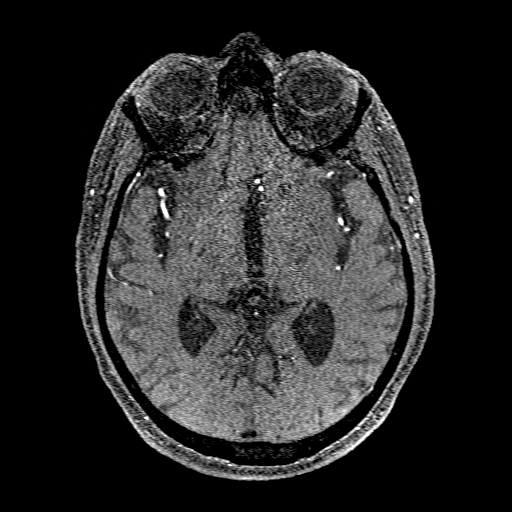
[im 134/193]
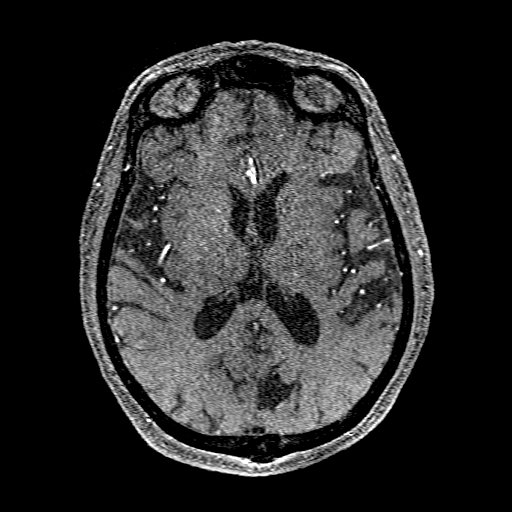
[im 159/193]
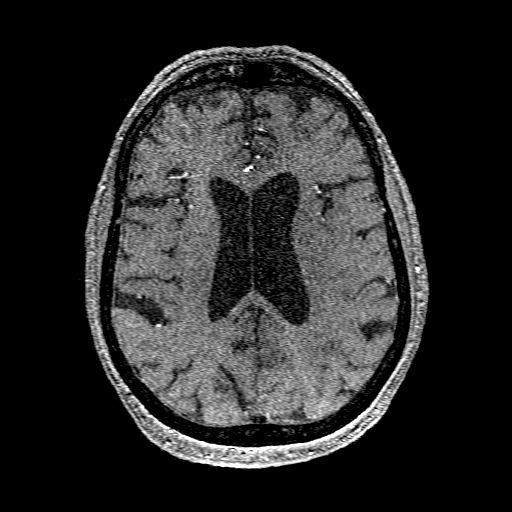
[im 163/193]
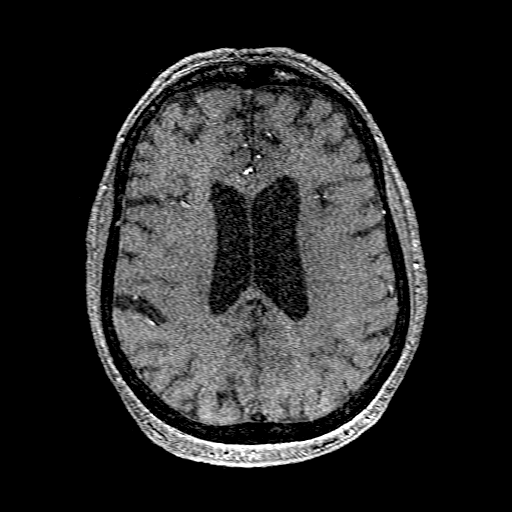
[im 184/193]
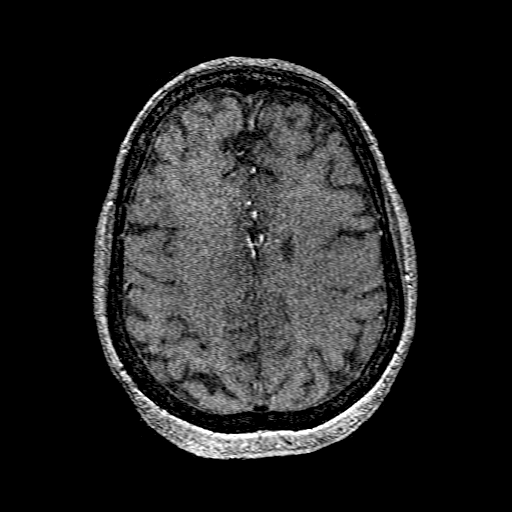

[Series 201: pjn:ax (id) · sagittal · 1.0mm · 0.43mm/px · 1 of 4 slices shown]
[im 1/4]
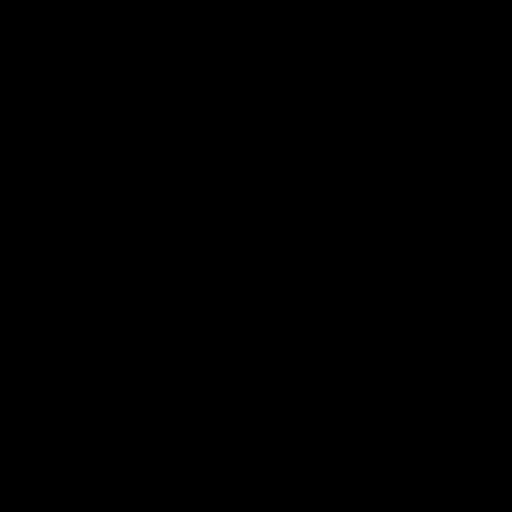

[20 of 48 positions shown; findings below may reference images not displayed]

FINDINGS: POSTERIOR CIRCULATION:

--Vertebral arteries: Normal

--Inferior cerebellar arteries: Normal.

--Basilar artery: Normal.

--Superior cerebellar arteries: Normal.

--Posterior cerebral arteries: Normal.

ANTERIOR CIRCULATION:

--Intracranial internal carotid arteries: Normal.

--Anterior cerebral arteries (ACA): Normal.

--Middle cerebral arteries (MCA): Normal.

ANATOMIC VARIANTS: Fetal origin of the right PCA.
IMPRESSION: Normal intracranial MRA.

## 2020-09-08 IMAGING — MR MR HEAD W/O CM
6 of 11 series · 24 of 48 positions shown · non-contrast
Comparison: None.

CLINICAL DATA: Stroke follow-up

EXAM:
MRI HEAD WITHOUT CONTRAST
TECHNIQUE: Multiplanar, multiecho pulse sequences of the brain and surrounding
structures were obtained without intravenous contrast.

[Series 2: DWI · axial · 3.0mm · 0.94mm/px · z∈[-92,+52]mm · 7 of 100 slices shown (1 of 2)]
[im 1/100]
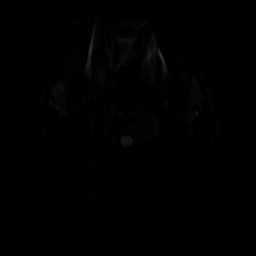
[im 17/100]
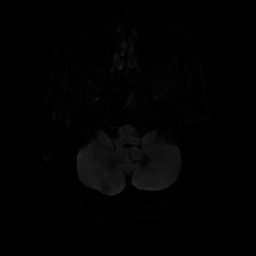
[im 34/100]
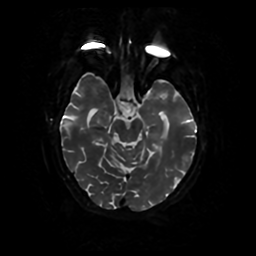
[im 50/100]
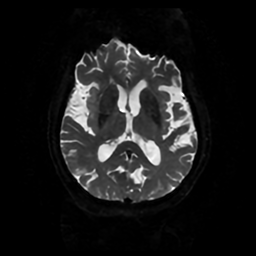
[im 67/100]
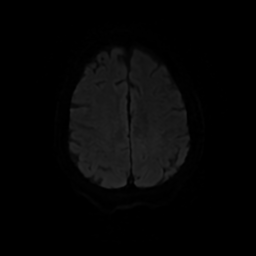
[im 83/100]
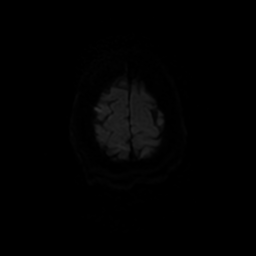
[im 100/100]
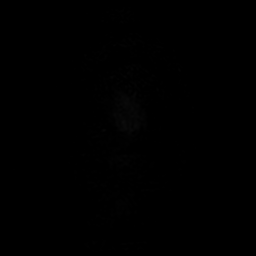

[Series 3: DWI · coronal · 4.0mm · 0.94mm/px · 5 of 74 slices shown (2 of 2)]
[im 1/74]
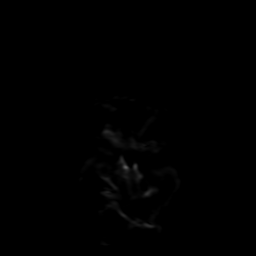
[im 19/74]
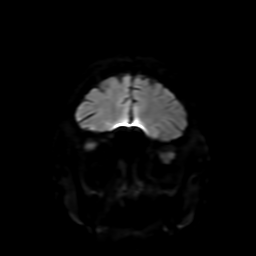
[im 37/74]
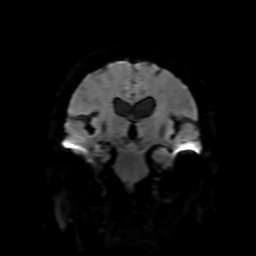
[im 55/74]
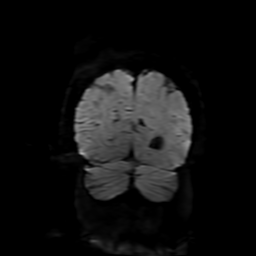
[im 74/74]
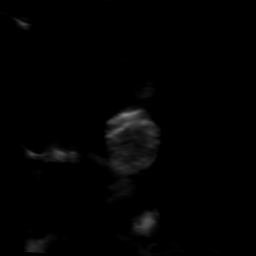

[Series 4: FLAIR · sagittal · 5.0mm · 0.23mm/px · 2 of 26 slices shown (1 of 2)]
[im 1/26]
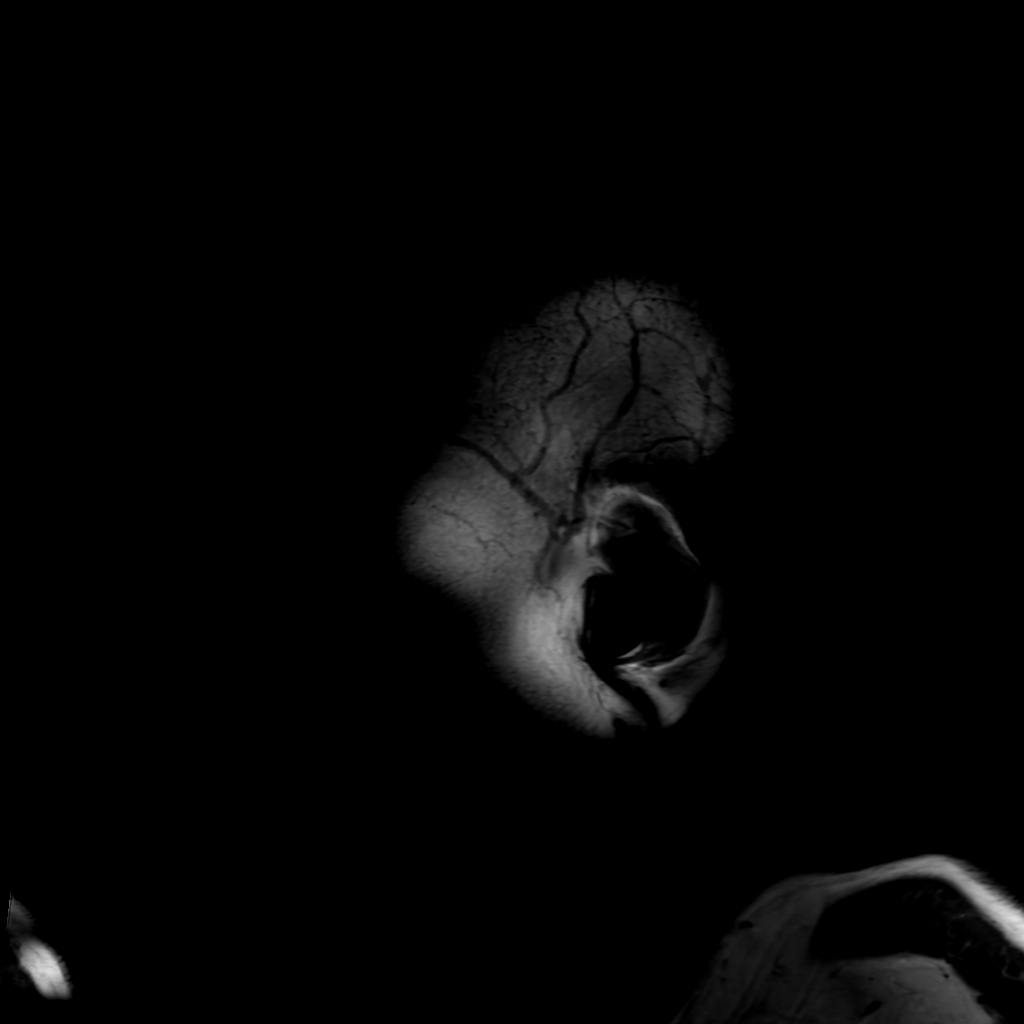
[im 26/26]
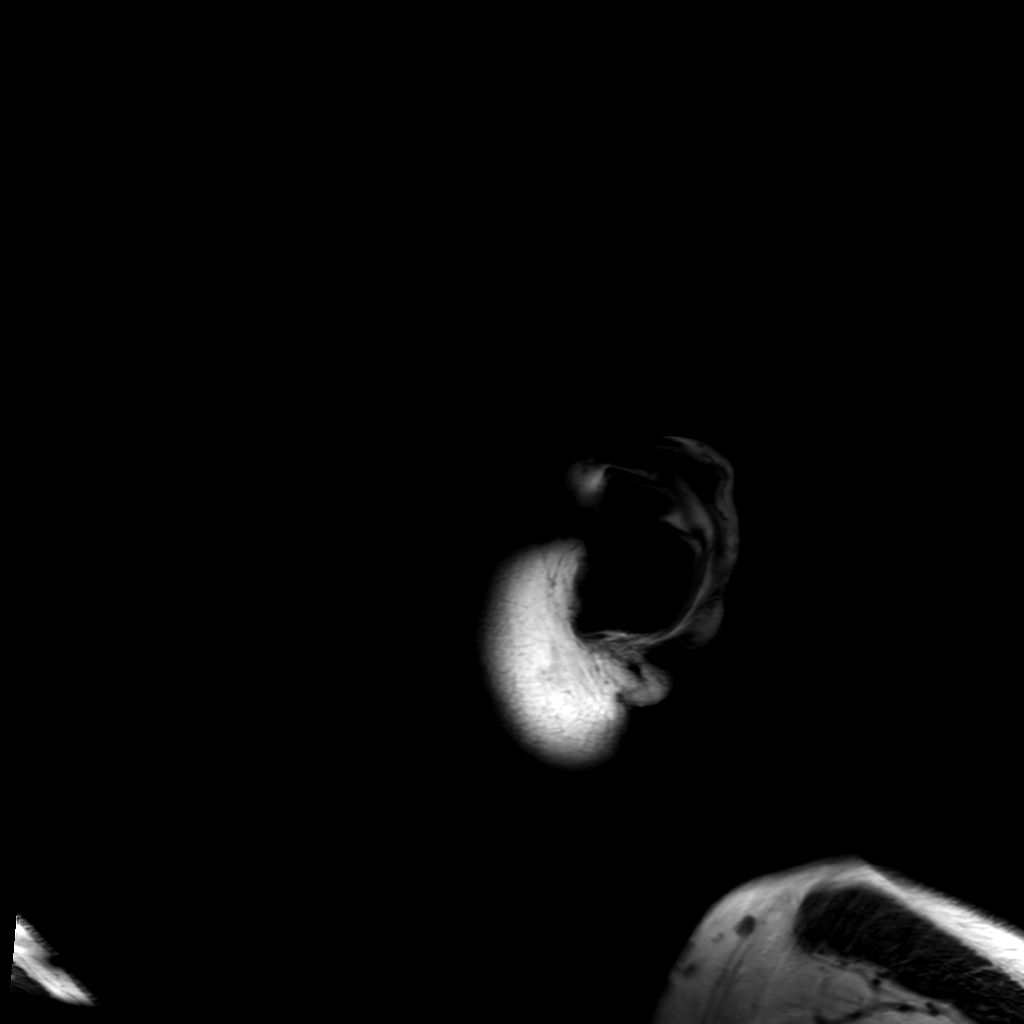

[Series 6: FLAIR · axial · 4.0mm · 0.45mm/px · z∈[-94,+52]mm · 3 of 35 slices shown (2 of 2)]
[im 1/35]
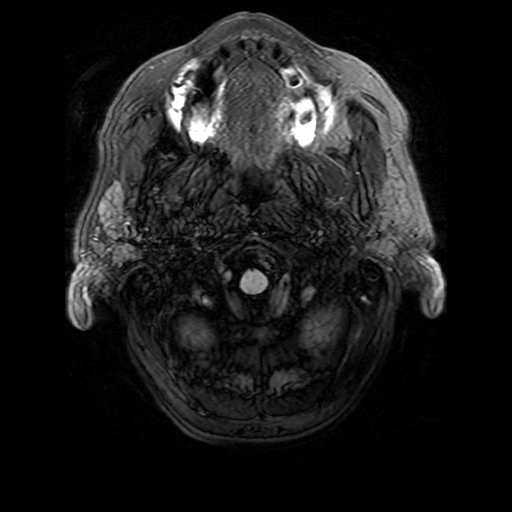
[im 18/35]
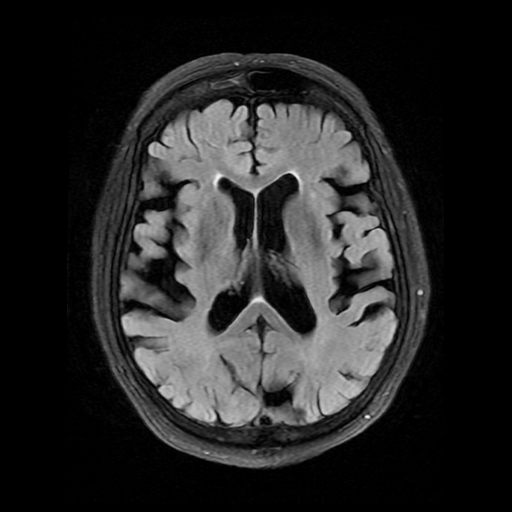
[im 35/35]
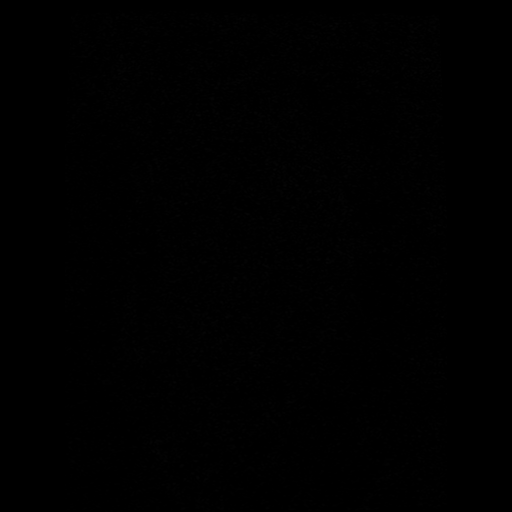

[Series 250: ADC · axial · 3.0mm · 0.94mm/px · z∈[-92,+52]mm · 4 of 50 slices shown (1 of 2)]
[im 1/50]
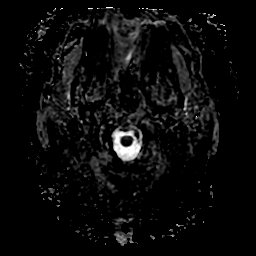
[im 17/50]
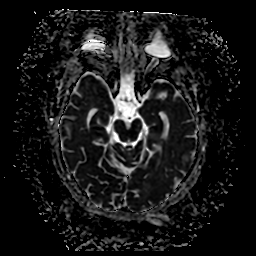
[im 33/50]
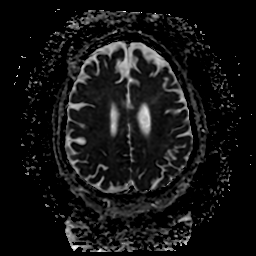
[im 50/50]
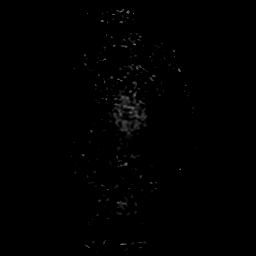

[Series 350: ADC · coronal · 4.0mm · 0.94mm/px · 3 of 37 slices shown (2 of 2)]
[im 1/37]
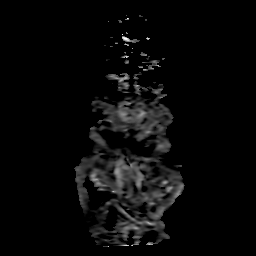
[im 19/37]
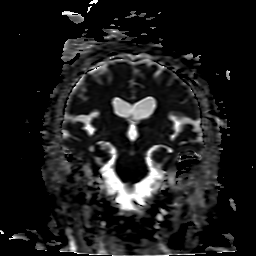
[im 37/37]
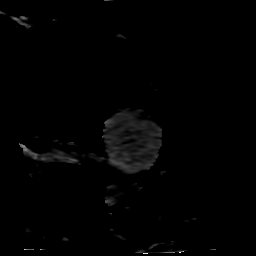

[24 of 48 positions shown; findings below may reference images not displayed]

FINDINGS: Brain: No acute infarct, mass effect or extra-axial collection. No
acute or chronic hemorrhage. There is multifocal hyperintense
T2-weighted signal within the white matter. Generalized volume loss
without a clear lobar predilection. The midline structures are
normal.

Vascular: Major flow voids are preserved.

Skull and upper cervical spine: Normal calvarium and skull base.
Visualized upper cervical spine and soft tissues are normal.

Sinuses/Orbits:No paranasal sinus fluid levels or advanced mucosal
thickening. No mastoid or middle ear effusion. Normal orbits.
IMPRESSION: 1. No acute intracranial abnormality.
2. Findings of chronic small vessel ischemia and generalized volume
loss.

## 2020-09-08 IMAGING — CT CT HEAD CODE STROKE
3 series · 15 of 47 positions shown, 18 images · non-contrast
Comparison: [DATE]

CLINICAL DATA: Code stroke. Left-sided facial droop beginning this
afternoon.

EXAM:
CT HEAD WITHOUT CONTRAST
TECHNIQUE: Contiguous axial images were obtained from the base of the skull
through the vertex without intravenous contrast.

[Series 3: head 5.0 st · axial · 0.42mm/px · z∈[-128,+2]mm · 9 of 32 slices shown, 12 images]
[im 3/32  brain]
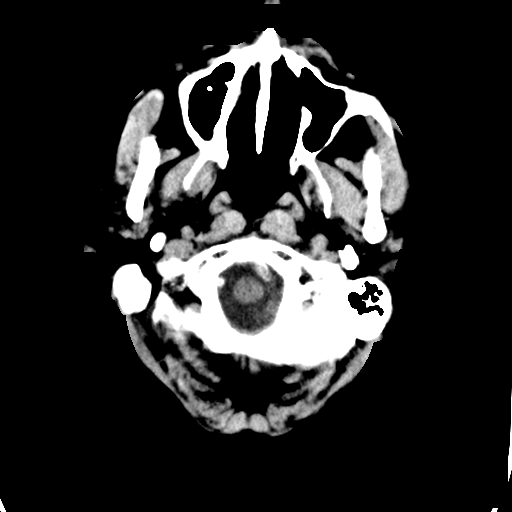
[im 3/32  bone]
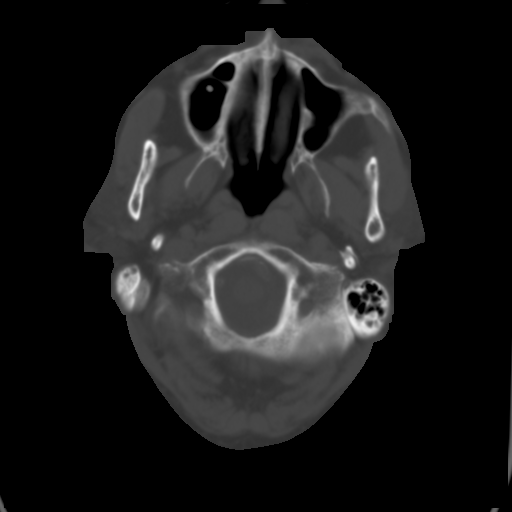
[im 6/32  brain]
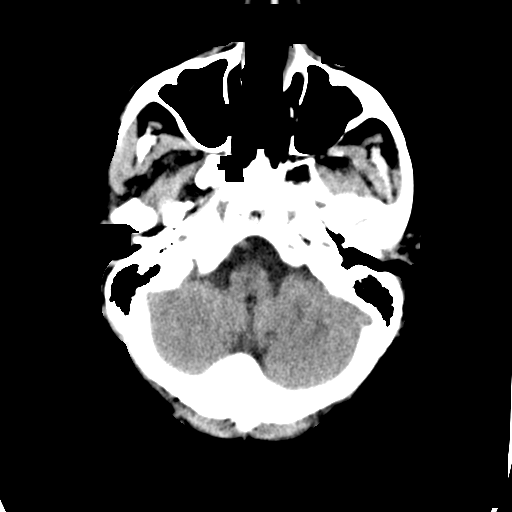
[im 9/32  brain]
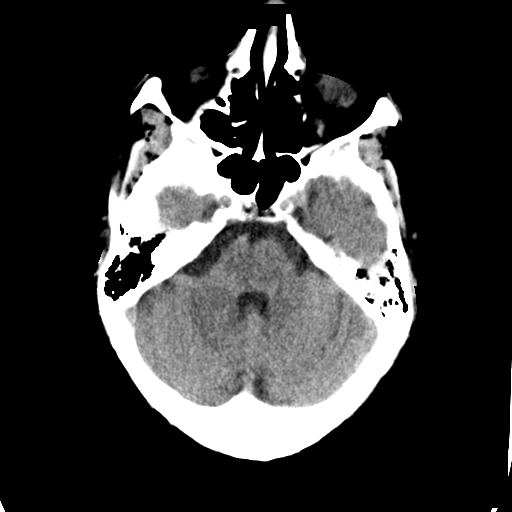
[im 12/32  brain]
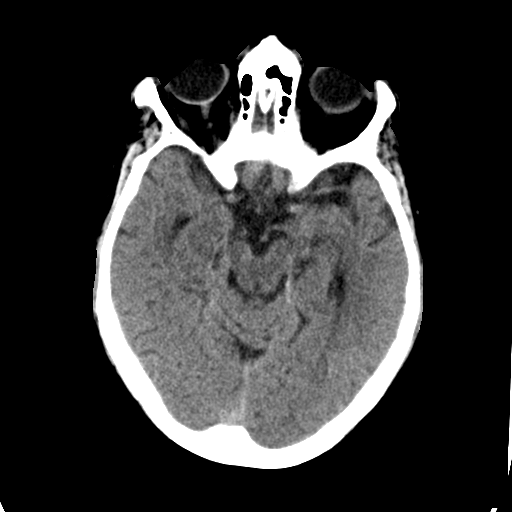
[im 17/32  brain]
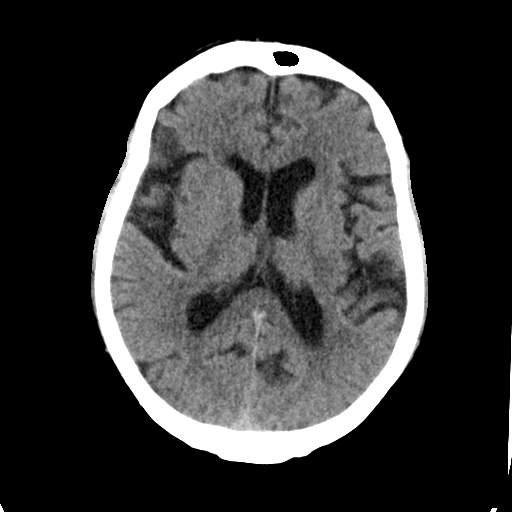
[im 17/32  bone]
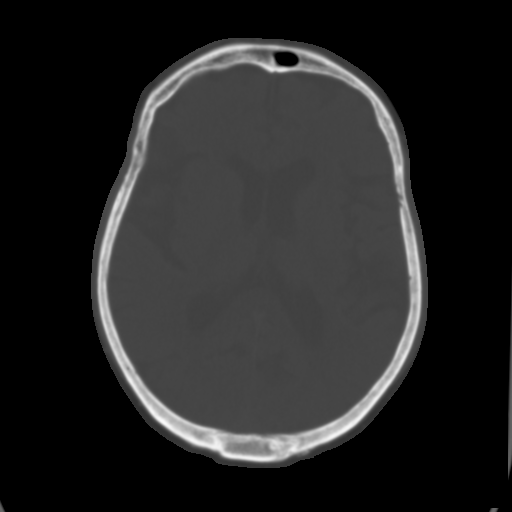
[im 20/32  brain]
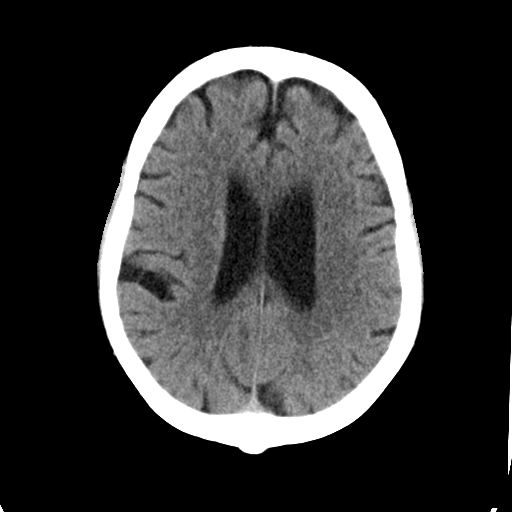
[im 23/32  brain]
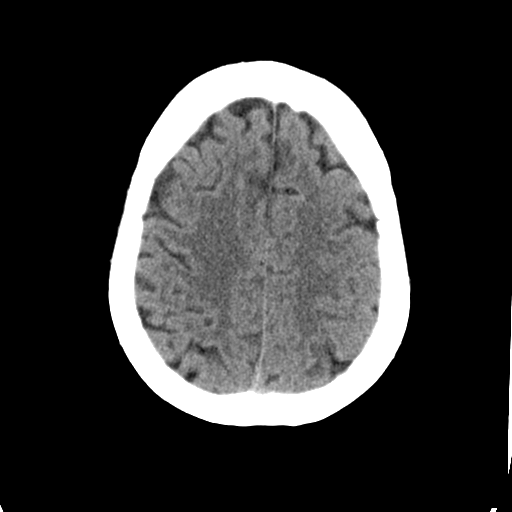
[im 26/32  brain]
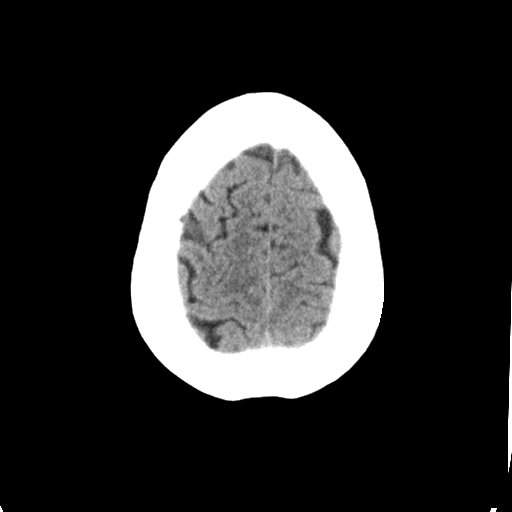
[im 29/32  brain]
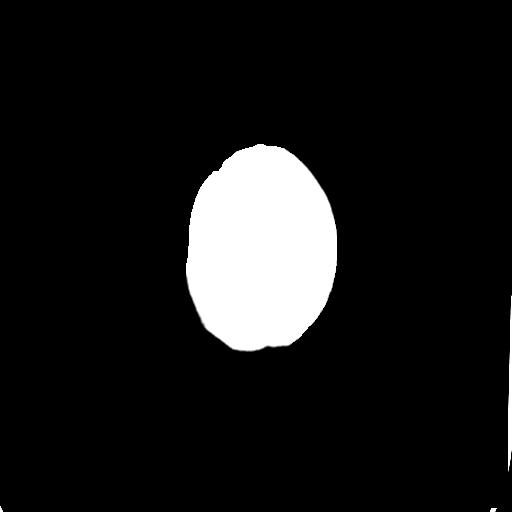
[im 29/32  bone]
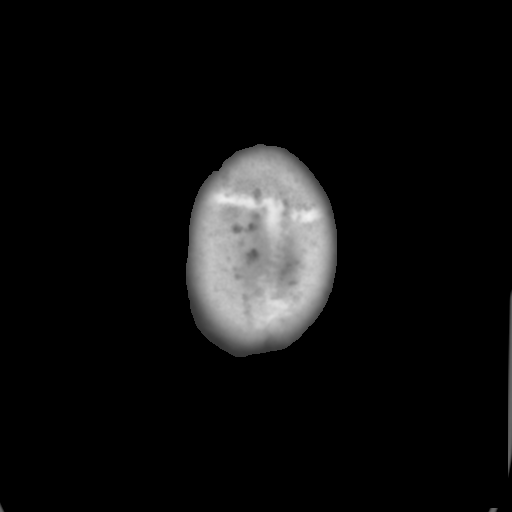

[Series 5: head 3.0 cor st · coronal · 0.30mm/px · 3 of 67 slices shown]
[im 23/67  brain]
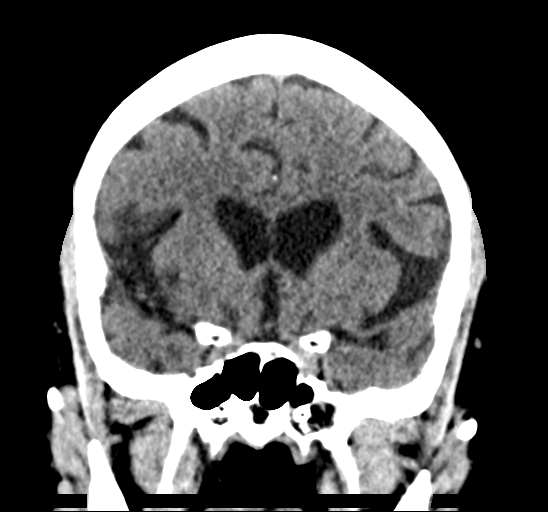
[im 30/67  brain]
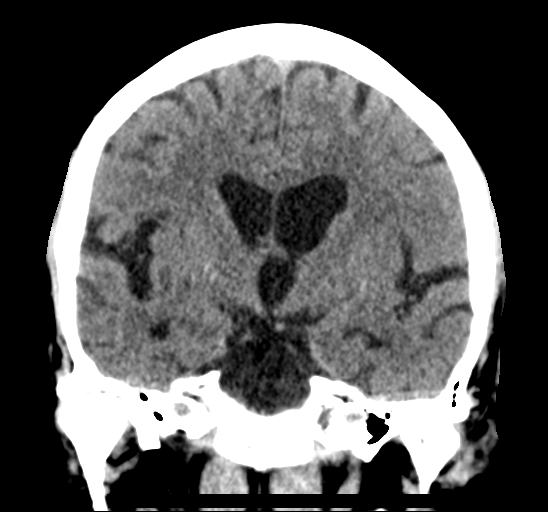
[im 37/67  brain]
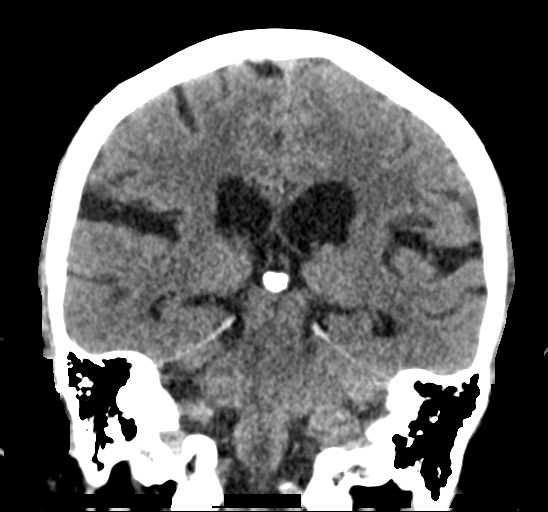

[Series 6: head 3.0 sag st · sagittal · 0.30mm/px · 3 of 58 slices shown]
[im 20/58  brain]
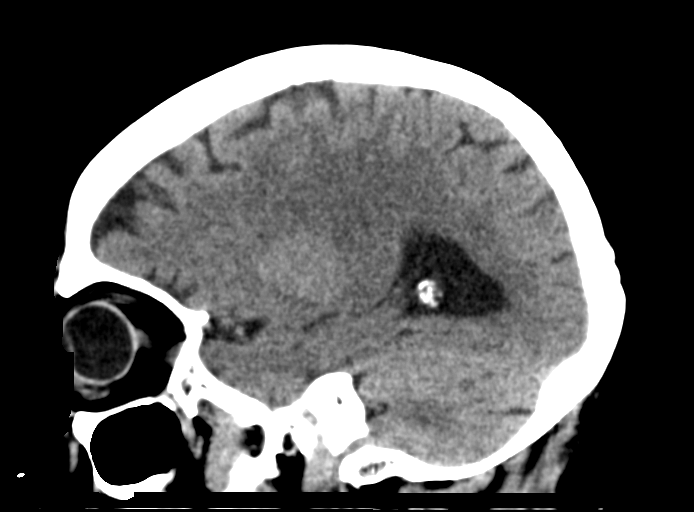
[im 29/58  brain]
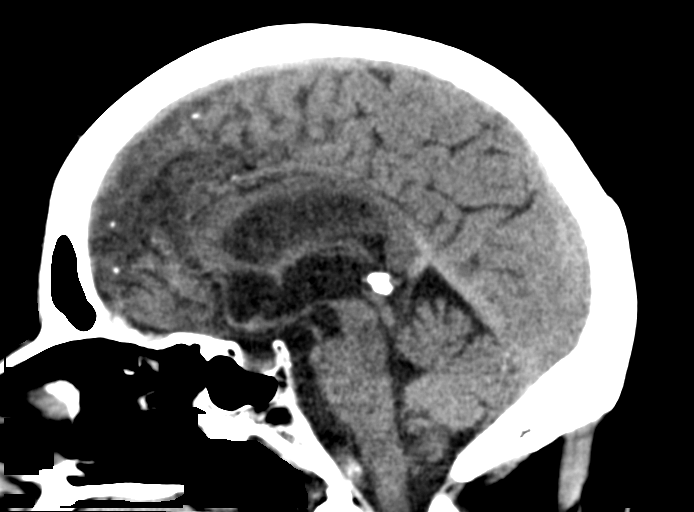
[im 39/58  brain]
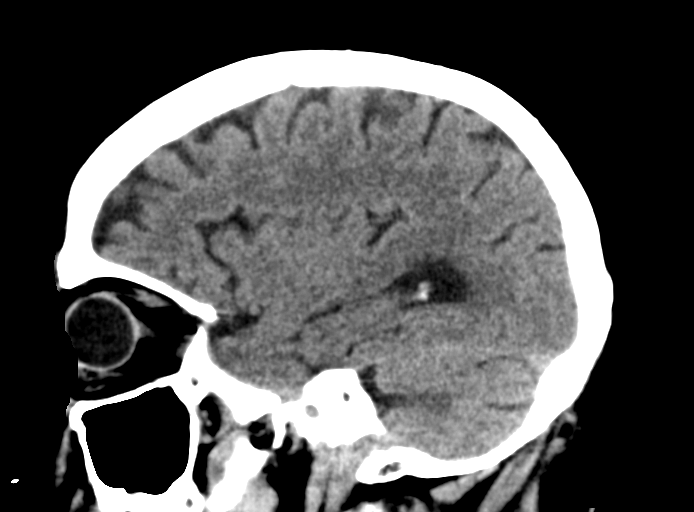

[15 of 47 positions shown; findings below may reference images not displayed]

FINDINGS: Brain: Mild age related volume loss. No focal abnormality seen
affecting the brainstem or cerebellum. Cerebral hemispheres show
minimal small vessel change of the white matter. No cortical or
large vessel territory infarction. No mass lesion, hemorrhage,
hydrocephalus or extra-axial collection.

Vascular: There is atherosclerotic calcification of the major
vessels at the base of the brain. No evidence of hyperdense distal
vessel.

Skull: Normal

Sinuses/Orbits: Clear/normal

Other: None

ASPECTS ([place] Stroke Program Early CT Score)

- Ganglionic level infarction (caudate, lentiform nuclei, internal
capsule, insula, M1-M3 cortex): 7

- Supraganglionic infarction (M4-M6 cortex): 3

Total score (0-10 with 10 being normal): 10
IMPRESSION: 1. No acute finding by CT. Mild chronic small-vessel change of the
cerebral hemispheric white matter.
2. ASPECTS is 10.
3. These results were communicated to Dr. NYA at [DATE] pmon
[DATE]by text page via the [employer] messaging system.

## 2020-09-08 MED ORDER — ACETAMINOPHEN 160 MG/5ML PO SOLN: 650.0000 mg | ORAL | Status: DC | PRN

## 2020-09-08 MED ORDER — LEVOTHYROXINE SODIUM 88 MCG PO TABS
88.0000 ug | ORAL_TABLET | Freq: Every day | ORAL | Status: DC
  Administered 2020-09-09: 88 ug via ORAL
  Filled 2020-09-08: qty 1

## 2020-09-08 MED ORDER — ANASTROZOLE 1 MG PO TABS
1.0000 mg | ORAL_TABLET | Freq: Every morning | ORAL | Status: DC
  Administered 2020-09-09: 1 mg via ORAL
  Filled 2020-09-08 (×2): qty 1

## 2020-09-08 MED ORDER — ONDANSETRON HCL 4 MG/2ML IJ SOLN: 4.0000 mg | Freq: Four times a day (QID) | INTRAMUSCULAR | Status: DC | PRN

## 2020-09-08 MED ORDER — LORATADINE 10 MG PO TABS: 10.0000 mg | ORAL_TABLET | Freq: Every day | ORAL | Status: DC | PRN

## 2020-09-08 MED ORDER — PANTOPRAZOLE SODIUM 40 MG PO TBEC
40.0000 mg | DELAYED_RELEASE_TABLET | Freq: Every day | ORAL | Status: DC
  Administered 2020-09-09: 40 mg via ORAL
  Filled 2020-09-08: qty 1

## 2020-09-08 MED ORDER — POLYETHYLENE GLYCOL 3350 17 G PO PACK: 17.0000 g | PACK | Freq: Every day | ORAL | Status: DC | PRN

## 2020-09-08 MED ORDER — STROKE: EARLY STAGES OF RECOVERY BOOK
Freq: Once | Status: AC
  Filled 2020-09-08: qty 1

## 2020-09-08 MED ORDER — MIDAZOLAM HCL 2 MG/2ML IJ SOLN
2.0000 mg | Freq: Once | INTRAMUSCULAR | Status: AC
  Administered 2020-09-08: 2 mg via INTRAVENOUS
  Filled 2020-09-08: qty 2

## 2020-09-08 MED ORDER — ASPIRIN EC 81 MG PO TBEC
81.0000 mg | DELAYED_RELEASE_TABLET | Freq: Every day | ORAL | Status: DC
  Administered 2020-09-08 – 2020-09-09 (×2): 81 mg via ORAL
  Filled 2020-09-08: qty 1

## 2020-09-08 MED ORDER — ACETAMINOPHEN 325 MG PO TABS
650.0000 mg | ORAL_TABLET | ORAL | Status: DC | PRN
  Administered 2020-09-09: 650 mg via ORAL
  Filled 2020-09-08: qty 2

## 2020-09-08 MED ORDER — ACETAMINOPHEN 650 MG RE SUPP: 650.0000 mg | RECTAL | Status: DC | PRN

## 2020-09-08 MED ORDER — LORAZEPAM 2 MG/ML IJ SOLN: 1.0000 mg | Freq: Once | INTRAMUSCULAR | Status: DC

## 2020-09-08 NOTE — ED Notes (Signed)
[facility] called to activate Stroke

## 2020-09-08 NOTE — ED Provider Notes (Signed)
[facility] EMERGENCY DEPARTMENT Provider Note   CSN: [id number] Arrival date & time: 09/08/20  1656  An emergency department physician performed an initial assessment on this suspected stroke patient at 1718.  History Chief Complaint  Patient presents with   Code Stroke    Claire Carlson is a 80 y.o. female.  HPI  Patient presented to the ED for evaluation of a left-sided facial droop.  Patient states that symptoms started this afternoon about 1 PM.  She noted that the left side of her face felt numb.  She also felt that it was drooping after she was trying to eat something.  Patient has had some intermittent pain in the left side of her neck and head.  Patient went to an urgent care and then was sent to the ED.  Code stroke was activated on arrival.  Patient does not have any trouble with her arms or legs.  No trouble with her speech.  Past Medical History:  Diagnosis Date   Anxiety    Anxiety disorder 07/14/2015   Aortic atherosclerosis ([place]) 11/03/2019   Formatting of this note might be different from the original. seen on x-rays at [facility] 09/2019   Barrett's esophagus    Breast mass 02/07/2017   Cancer ([place]) 02/2017   right breast cancer   Chronic kidney disease, stage III (moderate) ([place]) 5/40/0867   Complication of anesthesia    Degenerative lumbar disc 11/03/2019   Formatting of this note might be different from the original. xrays at [facility] 09/2019   Dysrhythmia    PVC's, PAC's   Essential hypertension 06/15/2015   GERD (gastroesophageal reflux disease)    Hammertoes of both feet 07/31/2018   High risk medication use 06/15/2015   Hypercalcemia 03/16/2020   Hypertension    Hypothyroidism    Hypothyroidism (acquired) 07/14/2015   Loss of hearing 06/15/2015   Last Assessment & Plan:  Relevant Hx: Course: Daily Update: Today's Plan:with some removal of dry wax and skin and recommendation to use some sweet oil for her ears and softening them up more, she was as well  given some flonase to use for her eustachian tube   Electronically signed by: Mayer Camel, NP 06/15/15 2201  Last Assessment & Plan:  Formatting of this note might be different   Malignant neoplasm of central portion of left breast in female, estrogen receptor negative ([place]) 02/07/2017   Mixed hyperlipidemia 06/15/2015   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 Uses reclast.   Pain of left foot 07/31/2018   Pedal edema 10/15/2017   Polyarthralgia 02/05/2020   PONV (postoperative nausea and vomiting)    Post-menopausal 05/03/2017   Pre-ulcerative calluses 07/31/2018   Prediabetes 06/15/2015   PVC (premature ventricular contraction)    Stage 3a chronic kidney disease ([place]) 06/15/2015   Vertigo    Vitamin D deficiency 06/15/2015    Patient Active Problem List   Diagnosis Date Noted   Anxiety    Barrett's esophagus    Complication of anesthesia    Dysrhythmia    Hypertension    Hypothyroidism    PONV (postoperative nausea and vomiting)    Vertigo    Hypercalcemia 03/16/2020   Polyarthralgia 02/05/2020   Aortic atherosclerosis ([place]) 11/03/2019   Degenerative lumbar disc 11/03/2019   Hammertoes of both feet 07/31/2018   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  Pain of left foot 07/31/2018   Pre-ulcerative calluses 07/31/2018   Pedal edema 10/15/2017   Post-menopausal 05/03/2017   Nonsustained ventricular tachycardia ([place]) 03/14/2017   Cancer ([place]) 02/2017   Breast mass 02/07/2017   Malignant neoplasm of central portion of left breast in female, estrogen receptor negative ([place]) 02/07/2017   Hypothyroidism (acquired) 07/14/2015   Obesity (BMI 30-39.9) 07/14/2015   Anxiety disorder 07/14/2015   Essential hypertension 06/15/2015   Mixed hyperlipidemia 06/15/2015   PVC (premature ventricular contraction) 06/15/2015   Chronic  kidney disease, stage III (moderate) (HCC) 06/15/2015   GERD (gastroesophageal reflux disease) 06/15/2015   High risk medication use 06/15/2015   Loss of hearing 06/15/2015   Osteopenia 06/15/2015   Prediabetes 06/15/2015   Vitamin D deficiency 06/15/2015   Stage 3a chronic kidney disease ([place]) 06/15/2015    Past Surgical History:  Procedure Laterality Date   BREAST LUMPECTOMY WITH RADIOACTIVE SEED AND SENTINEL LYMPH NODE BIOPSY Right 04/06/2017   Procedure: RIGHT BREAST LUMPECTOMY WITH RADIOACTIVE SEED X 2 AND SENTINEL LYMPH NODE BIOPSY;  Surgeon: Excell Seltzer, MD;  Location: [facility];  Service: General;  Laterality: Right;   CESAREAN SECTION     CHOLECYSTECTOMY     EYE SURGERY     cataracts   MOUTH SURGERY     TONSILLECTOMY       OB History   No obstetric history on file.     Family History  Problem Relation Age of Onset   Heart attack Father     Social History   Tobacco Use   Smoking status: Former    Pack years: 0.00    Types: Cigarettes    Quit date: 1985    Years since quitting: 37.4   Smokeless tobacco: Never  Vaping Use   Vaping Use: Never used  Substance Use Topics   Alcohol use: No   Drug use: No    Home Medications Prior to Admission medications   Medication Sig Start Date End Date Taking? Authorizing Provider  anastrozole (ARIMIDEX) 1 MG tablet Take 1 tablet (1 mg total) by mouth daily. 01/27/20   Mosher, Vida Roller A, PA-C  atenolol (TENORMIN) 25 MG tablet Take 12.5 mg by mouth daily. 11/22/16   [provider]  Calcium Carbonate-Vitamin D 600-400 MG-UNIT tablet Take 1 tablet by mouth daily.    [provider]  Cholecalciferol 25 MCG (1000 UT) tablet Take 2,000 Units by mouth daily.     [provider]  CINNAMON PO Take 1 tablet by mouth daily.    [provider]  hydrochlorothiazide (HYDRODIURIL) 25 MG tablet Take 25 mg by mouth daily. 09/13/18   [provider]  levothyroxine (SYNTHROID,  LEVOTHROID) 88 MCG tablet Take 88 mcg by mouth daily. 11/22/16   [provider]  Magnesium 250 MG TABS Take 250 mg by mouth daily.    [provider]  Meclizine HCl 25 MG CHEW Chew 25 mg by mouth daily as needed (dizziness). 03/01/16   [provider]  Multiple Vitamin (MULTIVITAMIN WITH MINERALS) TABS tablet Take 1 tablet by mouth daily.    [provider]  omeprazole (PRILOSEC) 20 MG capsule Take 20 mg by mouth daily.    [provider]  vitamin E 180 MG (400 UNITS) capsule Take 400 Units by mouth daily.    [provider]  vitamin E 200 UNIT capsule Take 200 Units by mouth daily.     [provider]    Allergies    Penicillin  g, Rosuvastatin, and Biaxin [clarithromycin]  Review of Systems   Review of Systems  All other systems reviewed and are negative.  Physical Exam Updated Vital Signs BP (!) 169/92   Pulse 76   Temp 98.7 F (37.1 C) (Oral)   Resp 15   SpO2 94%   Physical Exam Vitals and nursing note reviewed.  Constitutional:      General: She is not in acute distress.    Appearance: She is well-developed.  HENT:     Head: Normocephalic and atraumatic.     Right Ear: External ear normal.     Left Ear: External ear normal.  Eyes:     General: No scleral icterus.       Right eye: No discharge.        Left eye: No discharge.     Conjunctiva/sclera: Conjunctivae normal.  Neck:     Trachea: No tracheal deviation.  Cardiovascular:     Rate and Rhythm: Normal rate and regular rhythm.  Pulmonary:     Effort: Pulmonary effort is normal. No respiratory distress.     Breath sounds: Normal breath sounds. No stridor. No wheezing or rales.  Abdominal:     General: Bowel sounds are normal. There is no distension.     Palpations: Abdomen is soft.     Tenderness: There is no abdominal tenderness. There is no guarding or rebound.  Musculoskeletal:        General: No tenderness.     Cervical back: Neck supple.   Skin:    General: Skin is warm and dry.     Findings: No rash.  Neurological:     Mental Status: She is alert and oriented to person, place, and time.     Cranial Nerves: No cranial nerve deficit (Left facial droop, difficulty closing left eye lid ,extraocular movements intact, tongue midline).     Sensory: No sensory deficit.     Motor: No abnormal muscle tone or seizure activity.     Coordination: Coordination normal.     Comments: No pronator drift bilateral upper extrem, able to hold both legs off bed for 5 seconds, sensation intact in all extremities, no visual field cuts, no left or right sided neglect, normal finger-nose exam bilaterally, no nystagmus noted     ED Results / Procedures / Treatments   Labs (all labs ordered are listed, but only abnormal results are displayed) Labs Reviewed  CBC - Abnormal; Notable for the following components:      Result Value   RBC 5.14 (*)    All other components within normal limits  COMPREHENSIVE METABOLIC PANEL - Abnormal; Notable for the following components:   Sodium 134 (*)    Chloride 97 (*)    Glucose, Bld 107 (*)    Creatinine, Ser 1.09 (*)    GFR, Estimated 51 (*)    All other components within normal limits  I-STAT CHEM 8, ED - Abnormal; Notable for the following components:   Chloride 97 (*)    Creatinine, Ser 1.10 (*)    Glucose, Bld 104 (*)    All other components within normal limits  RESP PANEL BY RT-PCR (FLU A&B, COVID) ARPGX2  ETHANOL  PROTIME-INR  APTT  DIFFERENTIAL  RAPID URINE DRUG SCREEN, HOSP PERFORMED  URINALYSIS, ROUTINE W REFLEX MICROSCOPIC    EKG EKG Interpretation  Date/Time:  Wednesday September 08 2020 17:41:13 EDT Ventricular Rate:  83 PR Interval:  180 QRS Duration: 88 QT Interval:  393 QTC Calculation: 462  R Axis:   39 Text Interpretation: Sinus rhythm Since last tracing rate faster Confirmed by Dorie Rank [phone]) on 09/08/2020 5:42:58 PM  Radiology CT HEAD CODE STROKE WO CONTRAST  Result  Date: 09/08/2020 CLINICAL DATA:  Code stroke. Left-sided facial droop beginning this afternoon. EXAM: CT HEAD WITHOUT CONTRAST TECHNIQUE: Contiguous axial images were obtained from the base of the skull through the vertex without intravenous contrast. COMPARISON:  08/09/2014 FINDINGS: Brain: Mild age related volume loss. No focal abnormality seen affecting the brainstem or cerebellum. Cerebral hemispheres show minimal small vessel change of the white matter. No cortical or large vessel territory infarction. No mass lesion, hemorrhage, hydrocephalus or extra-axial collection. Vascular: There is atherosclerotic calcification of the major vessels at the base of the brain. No evidence of hyperdense distal vessel. Skull: Normal Sinuses/Orbits: Clear/normal Other: None ASPECTS ([place] Stroke Program Early CT Score) - Ganglionic level infarction (caudate, lentiform nuclei, internal capsule, insula, M1-M3 cortex): 7 - Supraganglionic infarction (M4-M6 cortex): 3 Total score (0-10 with 10 being normal): 10 IMPRESSION: 1. No acute finding by CT. Mild chronic small-vessel change of the cerebral hemispheric white matter. 2. ASPECTS is 10. 3. These results were communicated to Dr. Cheral Marker at 5:32 pmon 6/15/2022by text page via the [facility] messaging system. Electronically Signed   By: Nelson Chimes M.D.   On: 09/08/2020 17:34    Procedures Procedures   Medications Ordered in ED Medications - No data to display  ED Course  I have reviewed the triage vital signs and the nursing notes.  Pertinent labs & imaging results that were available during my care of the patient were reviewed by me and considered in my medical decision making (see chart for details).    MDM Rules/Calculators/A&P                          Initial evaluation concern for stroke versus Bell's palsy.  On exam she does appear to have involvement of her forehead and eyelid.  Patient was evaluated by neurology Dr. Cheral Marker on arrival.  Patient was not  felt to be a stroke candidate.  He recommended admission to the hospital for MRI/MRA as well as PT OT consult echocardiogram carotid Dopplers.  Patient has remained stable.  I will consult the medical service for admission Final Clinical Impression(s) / ED Diagnoses Final diagnoses:  Facial palsy     Dorie Rank, MD 09/08/20 [phone]

## 2020-09-08 NOTE — Consult Note (Signed)
Referring Physician: Dr. Tomi Bamberger    Chief Complaint: Acute onset of left facial droop and left sided tongue tingling  HPI: Kiran Lapine is an 80 y.o. female with a PMHx of aortic atherosclerosis, right breast cancer s/p lumpectomy with resection of 2 lymph nodes, CKD3, HTN, anxiety, hypercalcemia, hypothyroidism, HLD, obesity, prediabetes and vitamin D deficiency who presents with acute onset of left facial droop and left sided tongue tingling. She initially went to Urgent Care for her symptoms and she was referred to [facility]. Code Stroke was activated by the ED. LKN was 1300. Her symptoms started while she was eating lunch. She had no changes to her speech. She had no limb weakness or numbness. She has no prior history of stroke. She is not on an antiplatelet medication or a blood thinner.   .   LSN: 1300 tPA Given: No: Symptoms resolved NIHSS: 0  Past Medical History:  Diagnosis Date   Anxiety    Anxiety disorder 07/14/2015   Aortic atherosclerosis ([place]) 11/03/2019   Formatting of this note might be different from the original. seen on x-rays at [facility] 09/2019   Barrett's esophagus    Breast mass 02/07/2017   Cancer ([place]) 02/2017   right breast cancer   Chronic kidney disease, stage III (moderate) ([place]) 07/10/6061   Complication of anesthesia    Degenerative lumbar disc 11/03/2019   Formatting of this note might be different from the original. xrays at [facility] 09/2019   Dysrhythmia    PVC's, PAC's   Essential hypertension 06/15/2015   GERD (gastroesophageal reflux disease)    Hammertoes of both feet 07/31/2018   High risk medication use 06/15/2015   Hypercalcemia 03/16/2020   Hypertension    Hypothyroidism    Hypothyroidism (acquired) 07/14/2015   Loss of hearing 06/15/2015   Last Assessment & Plan:  Relevant Hx: Course: Daily Update: Today's Plan:with some removal of dry wax and skin and recommendation to use some sweet oil for her ears and softening them up more, she was as well given some  flonase to use for her eustachian tube   Electronically signed by: Mayer Camel, NP 06/15/15 2201  Last Assessment & Plan:  Formatting of this note might be different   Malignant neoplasm of central portion of left breast in female, estrogen receptor negative ([place]) 02/07/2017   Mixed hyperlipidemia 06/15/2015   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 Uses reclast.   Pain of left foot 07/31/2018   Pedal edema 10/15/2017   Polyarthralgia 02/05/2020   PONV (postoperative nausea and vomiting)    Post-menopausal 05/03/2017   Pre-ulcerative calluses 07/31/2018   Prediabetes 06/15/2015   PVC (premature ventricular contraction)    Stage 3a chronic kidney disease ([place]) 06/15/2015   Vertigo    Vitamin D deficiency 06/15/2015    Past Surgical History:  Procedure Laterality Date   BREAST LUMPECTOMY WITH RADIOACTIVE SEED AND SENTINEL LYMPH NODE BIOPSY Right 04/06/2017   Procedure: RIGHT BREAST LUMPECTOMY WITH RADIOACTIVE SEED X 2 AND SENTINEL LYMPH NODE BIOPSY;  Surgeon: Excell Seltzer, MD;  Location: [facility];  Service: General;  Laterality: Right;   CESAREAN SECTION     CHOLECYSTECTOMY     EYE SURGERY     cataracts   MOUTH SURGERY     TONSILLECTOMY      Family History  Problem Relation Age of  Onset   Heart attack Father    Social History:  reports that she quit smoking about 37 years ago. She has never used smokeless tobacco. She reports that she does not drink alcohol and does not use drugs.  Allergies:  Allergies  Allergen Reactions   Penicillin G     Other reaction(s): Other (See Comments) welts   Rosuvastatin Other (See Comments)   Biaxin [Clarithromycin] Rash    Home Medications:  No current facility-administered medications on file prior to encounter.   Current Outpatient Medications on  File Prior to Encounter  Medication Sig Dispense Refill   anastrozole (ARIMIDEX) 1 MG tablet Take 1 tablet (1 mg total) by mouth daily. 90 tablet 3   atenolol (TENORMIN) 25 MG tablet Take 12.5 mg by mouth daily.     Calcium Carbonate-Vitamin D 600-400 MG-UNIT tablet Take 1 tablet by mouth daily.     Cholecalciferol 25 MCG (1000 UT) tablet Take 2,000 Units by mouth daily.      CINNAMON PO Take 1 tablet by mouth daily.     hydrochlorothiazide (HYDRODIURIL) 25 MG tablet Take 25 mg by mouth daily.     levothyroxine (SYNTHROID, LEVOTHROID) 88 MCG tablet Take 88 mcg by mouth daily.     Magnesium 250 MG TABS Take 250 mg by mouth daily.     Meclizine HCl 25 MG CHEW Chew 25 mg by mouth daily as needed (dizziness).     Multiple Vitamin (MULTIVITAMIN WITH MINERALS) TABS tablet Take 1 tablet by mouth daily.     omeprazole (PRILOSEC) 20 MG capsule Take 20 mg by mouth daily.     vitamin E 180 MG (400 UNITS) capsule Take 400 Units by mouth daily.     vitamin E 200 UNIT capsule Take 200 Units by mouth daily.        ROS: As per HPI. Does not endorse any additional symptoms.   Physical Examination: Blood pressure (!) 179/90, pulse 78, temperature 98.7 F (37.1 C), temperature source Oral, resp. rate 18, SpO2 99 %.  HEENT: [place]/AT Lungs: Respirations unlabored Ext: No edema  Neurologic Examination Mental Status: Awake and alert with anxious affect. Speech fluent with intact naming and comprehension. Able to follow all commands without difficulty. Cranial Nerves: II:  Temporal visual fields intact with no extinction to DSS. PERRL.  III,IV, VI: No ptosis. EOMI. No nystagmus.  V,VII: Smile and grimace are symmetric. Temp sensation equal bilaterally.   VIII: hearing intact to voice IX,X: No hypophonia XI: Symmetric XII: Midline tongue extension  Motor: Right : Upper extremity   5/5    Left:     Upper extremity   5/5  Lower extremity   5/5     Lower extremity   5/5 No pronator drift Sensory: Temp and  light touch intact throughout, bilaterally Deep Tendon Reflexes:  Normoactive Plantars: Right: downgoing   Left: downgoing Cerebellar: No ataxia with FNF bilaterally Gait: Deferred    Results for orders placed or performed during the hospital encounter of 09/08/20 (from the past 48 hour(s))  Ethanol     Status: None   Collection Time: 09/08/20  5:15 PM  Result Value Ref Range   Alcohol, Ethyl (B) <10 <10 mg/dL    Comment: (NOTE) Lowest detectable limit for serum alcohol is 10 mg/dL.  For medical purposes only. Performed at [facility], [place] [address] [place], [place] [address]   Protime-INR     Status: None   Collection Time: 09/08/20  5:15 PM  Result  Value Ref Range   Prothrombin Time 12.9 11.4 - 15.2 seconds   INR 1.0 0.8 - 1.2    Comment: (NOTE) INR goal varies based on device and disease states. Performed at [facility], [place] [address], [place], [place] [address]   APTT     Status: None   Collection Time: 09/08/20  5:15 PM  Result Value Ref Range   aPTT 36 24 - 36 seconds    Comment: Performed at [facility] [address], [place], [place] [address]  CBC     Status: Abnormal   Collection Time: 09/08/20  5:15 PM  Result Value Ref Range   WBC 7.4 4.0 - 10.5 K/uL   RBC 5.14 (H) 3.87 - 5.11 MIL/uL   Hemoglobin 14.6 12.0 - 15.0 g/dL   HCT 43.4 36.0 - 46.0 %   MCV 84.4 80.0 - 100.0 fL   MCH 28.4 26.0 - 34.0 pg   MCHC 33.6 30.0 - 36.0 g/dL   RDW 13.3 11.5 - 15.5 %   Platelets 290 150 - 400 K/uL   nRBC 0.0 0.0 - 0.2 %    Comment: Performed at [facility], [place] [address], [place], [place] [address]  Differential     Status: None   Collection Time: 09/08/20  5:15 PM  Result Value Ref Range   Neutrophils Relative % 61 %   Neutro Abs 4.6 1.7 - 7.7 K/uL   Lymphocytes Relative 22 %   Lymphs Abs 1.6 0.7 - 4.0 K/uL   Monocytes Relative 11 %   Monocytes Absolute 0.8 0.1 - 1.0 K/uL   Eosinophils Relative 5 %   Eosinophils Absolute 0.3 0.0 -  0.5 K/uL   Basophils Relative 1 %   Basophils Absolute 0.1 0.0 - 0.1 K/uL   Immature Granulocytes 0 %   Abs Immature Granulocytes 0.02 0.00 - 0.07 K/uL    Comment: Performed at [facility], [place] [address] [place], [place] [address]  Comprehensive metabolic panel     Status: Abnormal   Collection Time: 09/08/20  5:15 PM  Result Value Ref Range   Sodium 134 (L) 135 - 145 mmol/L   Potassium 3.7 3.5 - 5.1 mmol/L   Chloride 97 (L) 98 - 111 mmol/L   CO2 28 22 - 32 mmol/L   Glucose, Bld 107 (H) 70 - 99 mg/dL    Comment: Glucose reference range applies only to samples taken after fasting for at least 8 hours.   BUN 14 8 - 23 mg/dL   Creatinine, Ser 1.09 (H) 0.44 - 1.00 mg/dL   Calcium 9.8 8.9 - 10.3 mg/dL   Total Protein 7.6 6.5 - 8.1 g/dL   Albumin 4.1 3.5 - 5.0 g/dL   AST 30 15 - 41 U/L   ALT 27 0 - 44 U/L   Alkaline Phosphatase 46 38 - 126 U/L   Total Bilirubin 0.5 0.3 - 1.2 mg/dL   GFR, Estimated 51 (L) >60 mL/min    Comment: (NOTE) Calculated using the CKD-EPI Creatinine Equation (2021)    Anion gap 9 5 - 15    Comment: Performed at [facility] [address] [place],  [address]  I-stat chem 8, ED     Status: Abnormal   Collection Time: 09/08/20  5:25 PM  Result Value Ref Range   Sodium 136 135 - 145 mmol/L   Potassium 4.0 3.5 - 5.1 mmol/L   Chloride 97 (L) 98 - 111  mmol/L   BUN 14 8 - 23 mg/dL   Creatinine, Ser 1.10 (H) 0.44 - 1.00 mg/dL   Glucose, Bld 104 (H) 70 - 99 mg/dL    Comment: Glucose reference range applies only to samples taken after fasting for at least 8 hours.   Calcium, Ion 1.23 1.15 - 1.40 mmol/L   TCO2 28 22 - 32 mmol/L   Hemoglobin 14.3 12.0 - 15.0 g/dL   HCT 42.0 36.0 - 46.0 %   CT HEAD CODE STROKE WO CONTRAST  Result Date: 09/08/2020 CLINICAL DATA:  Code stroke. Left-sided facial droop beginning this afternoon. EXAM: CT HEAD WITHOUT CONTRAST TECHNIQUE: Contiguous axial images were obtained from the base of the skull through the  vertex without intravenous contrast. COMPARISON:  08/09/2014 FINDINGS: Brain: Mild age related volume loss. No focal abnormality seen affecting the brainstem or cerebellum. Cerebral hemispheres show minimal small vessel change of the white matter. No cortical or large vessel territory infarction. No mass lesion, hemorrhage, hydrocephalus or extra-axial collection. Vascular: There is atherosclerotic calcification of the major vessels at the base of the brain. No evidence of hyperdense distal vessel. Skull: Normal Sinuses/Orbits: Clear/normal Other: None ASPECTS ([place] Stroke Program Early CT Score) - Ganglionic level infarction (caudate, lentiform nuclei, internal capsule, insula, M1-M3 cortex): 7 - Supraganglionic infarction (M4-M6 cortex): 3 Total score (0-10 with 10 being normal): 10 IMPRESSION: 1. No acute finding by CT. Mild chronic small-vessel change of the cerebral hemispheric white matter. 2. ASPECTS is 10. 3. These results were communicated to Dr. Cheral Marker at 5:32 pmon 6/15/2022by text page via the [facility] messaging system. Electronically Signed   By: Nelson Chimes M.D.   On: 09/08/2020 17:34    Assessment: 80 y.o. female presenting after acute onset of left facial droop and left tongue paresthesia at home 1. Exam is nonfocal at this time. NIHSS 0 2. Probable TIA as the etiology for presentation 3. CT head with no acute abnormality. Mild chronic small-vessel change of the cerebral hemispheric white matter. 4. Stroke Risk Factors - Aortic atherosclerosis, cancer history, CKD3, HTN, HLD, obesity and prediabetes   Recommendations: 1. HgbA1c, fasting lipid panel 2. MRI, MRA of the brain without contrast 3. PT consult, OT consult, Speech consult 4. Echocardiogram 5. Carotid dopplers 6. Prophylactic therapy - ASA 81 mg po qd, first dose now 7. Risk factor modification 8. Telemetry monitoring 9. Frequent neuro checks 10. Modified permissive HTN protocol given advanced age: treat SBP if > 180. Start  gradually normalizing with antihypertensive medications after 24 hours.   @Electronically  signed: Dr. Kerney Elbe  09/08/2020, 6:25 PM

## 2020-09-08 NOTE — ED Notes (Signed)
Pt is ambulatory to the bathroom.

## 2020-09-08 NOTE — ED Provider Notes (Signed)
Emergency Medicine Provider Triage Evaluation Note  Claire Carlson , a 80 y.o. female  was evaluated in triage.  Pt complains of left-sided facial droop that started around 12:30-1PM today. She states she was eating lunch and felt a tingling sensation to left side of face and tongue. No speech changes. She admits to visual changes a few days ago. No previous CVA.   Review of Systems  Positive: Facial droop Negative: fever  Physical Exam  BP (!) 185/97 (BP Location: Left Arm)   Pulse 84   Temp 98.7 F (37.1 C) (Oral)   Resp 12   SpO2 98%  Gen:   Awake, no distress   Resp:  Normal effort  MSK:   Moves extremities without difficulty  Other:  Left-sided facial droop. No weakness appreciated on exam  Medical Decision Making  Medically screening exam initiated at 5:15 PM.  Appropriate orders placed.  Claire Carlson was informed that the remainder of the evaluation will be completed by another provider, this initial triage assessment does not replace that evaluation, and the importance of remaining in the ED until their evaluation is complete.  Code stroke activated in triage. Patient in TPA window. CVA vs. Bell's palsy.    Claire Carlson 09/08/20 Claire Mend, MD 09/09/20 [phone]

## 2020-09-08 NOTE — ED Triage Notes (Signed)
Pt here from UC with L sided facial droop and numbness to L side of tongue. Pt states symptoms started at 1300 today. Pt also complains of intermittent L sided neck pain radiating into her head.

## 2020-09-08 NOTE — Code Documentation (Signed)
Stroke Response Nurse Documentation Code Documentation  Claire Carlson is a 80 y.o. female arriving to [facility] ED via [place] on 09/08/2020 with past medical hx of breast cancer, CKD III, HTN. Pt intially went to Urgent Care for her symptoms and she was referred to [facility]. Code stroke was activated by ED. Patient from home where she was LKW at 1300 and now complaining of left sided facial droop and tingling to the left side of her tongue.   On No antithrombotic. Labs drawn and patient cleared for CT by Dr. Tomi Bamberger. Patient to CT with team. NIHSS 0, see documentation for details and code stroke times. The following imaging was completed: CT.   Care/Plan: q2h NIHSS and VS Bedside handoff with ED RN Garlon Hatchet.    Lilly Gasser L Lott Seelbach  Rapid Response RN

## 2020-09-08 NOTE — H&P (Signed)
History and Physical    Claire Carlson [id number] DOB: 1940-08-25 DOA: 09/08/2020  PCP: Raina Mina., MD  Patient coming from: Home   Chief Complaint:  Chief Complaint  Patient presents with   Code Stroke     HPI:    80 year old female with past medical history of right breast cancer (S/P lumpectomy/chemo), hypertension, anxiety disorder, hypercalcemia, hypothyroidism, hyperlipidemia, prediabetes, vitamin D deficiency, GERD with Barrett's esophagu who presents to [facility] emergency department with complaints of left facial droop.  Patient explains that approximately 12:00 earlier today she was eating lunch with her husband when suddenly she realized she was having some difficulty chewing her food.  Her husband then realized that her left face was drooping.  Patient went to the bathroom and confirm this in the mirror, realizing that in addition to left facial droop she was having difficulty closing her left eye.  Upon further questioning patient denies headaches, loss of balance, focal weakness, slurred speech or changes in vision.  Patient does admit to a several day history of left ear pain and occipital headaches over the past several days however.  Patient symptoms persisted for several hours until patient eventually presented to [facility] emergency department for evaluation.  Upon evaluation in the emergency department patient was promptly evaluated by neurology and was recommended the patient be observed with work-up to rule out an underlying stroke including MR imaging of the brain.  CT imaging of the head without contrast was already performed revealing no evidence of acute stroke.  The hospitalist group was then called to assess the patient for admission to the hospital.  Review of Systems:   Review of Systems  Neurological:  Positive for focal weakness and headaches.  All other systems reviewed and are negative.  Past Medical History:   Diagnosis Date   Anxiety    Anxiety disorder 07/14/2015   Aortic atherosclerosis ([place]) 11/03/2019   Formatting of this note might be different from the original. seen on x-rays at [facility] 09/2019   Barrett's esophagus    Breast mass 02/07/2017   Cancer ([place]) 02/2017   right breast cancer   Chronic kidney disease, stage III (moderate) ([place]) 3/84/6659   Complication of anesthesia    Degenerative lumbar disc 11/03/2019   Formatting of this note might be different from the original. xrays at [facility] 09/2019   Dysrhythmia    PVC's, PAC's   Essential hypertension 06/15/2015   GERD (gastroesophageal reflux disease)    Hammertoes of both feet 07/31/2018   High risk medication use 06/15/2015   Hypercalcemia 03/16/2020   Hypertension    Hypothyroidism    Hypothyroidism (acquired) 07/14/2015   Loss of hearing 06/15/2015   Last Assessment & Plan:  Relevant Hx: Course: Daily Update: Today's Plan:with some removal of dry wax and skin and recommendation to use some sweet oil for her ears and softening them up more, she was as well given some flonase to use for her eustachian tube   Electronically signed by: Mayer Camel, NP 06/15/15 2201  Last Assessment & Plan:  Formatting of this note might be different   Malignant neoplasm of central portion of left breast in female, estrogen receptor negative ([place]) 02/07/2017   Mixed hyperlipidemia 06/15/2015   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 Uses reclast.   Pain of left foot 07/31/2018  Pedal edema 10/15/2017   Polyarthralgia 02/05/2020   PONV (postoperative nausea and vomiting)    Post-menopausal 05/03/2017   Pre-ulcerative calluses 07/31/2018   Prediabetes 06/15/2015   PVC (premature ventricular contraction)    Stage 3a chronic kidney disease ([place]) 06/15/2015   Vertigo    Vitamin D deficiency  06/15/2015    Past Surgical History:  Procedure Laterality Date   BREAST LUMPECTOMY WITH RADIOACTIVE SEED AND SENTINEL LYMPH NODE BIOPSY Right 04/06/2017   Procedure: RIGHT BREAST LUMPECTOMY WITH RADIOACTIVE SEED X 2 AND SENTINEL LYMPH NODE BIOPSY;  Surgeon: Excell Seltzer, MD;  Location: [facility];  Service: General;  Laterality: Right;   CESAREAN SECTION     CHOLECYSTECTOMY     EYE SURGERY     cataracts   MOUTH SURGERY     TONSILLECTOMY       reports that she quit smoking about 37 years ago. She has never used smokeless tobacco. She reports that she does not drink alcohol and does not use drugs.  Allergies  Allergen Reactions   Penicillin G Other (See Comments)    Welts   Rosuvastatin Other (See Comments)    Cramping of the legs   Statins Other (See Comments)    Cramping of the legs   Tape     Some tapes break out the skin- Paper tape is tolerated   Biaxin [Clarithromycin] Rash    Family History  Problem Relation Age of Onset   Heart attack Father      Prior to Admission medications   Medication Sig Start Date End Date Taking? Authorizing Provider  anastrozole (ARIMIDEX) 1 MG tablet Take 1 tablet (1 mg total) by mouth daily. Patient taking differently: Take 1 mg by mouth in the morning. 01/27/20  Yes Mosher, Vida Roller A, PA-C  atenolol (TENORMIN) 25 MG tablet Take 12.5 mg by mouth in the morning. 11/22/16  Yes [provider]  Calcium-Phosphorus-Vitamin D (CITRACAL +D3 PO) Take 1 tablet by mouth daily.   Yes [provider]  Cholecalciferol (VITAMIN D3) 50 MCG (2000 UT) TABS Take 2,000 Units by mouth in the morning.   Yes [provider]  CINNAMON PO Take 200 mg by mouth daily.   Yes [provider]  hydrochlorothiazide (HYDRODIURIL) 25 MG tablet Take 25 mg by mouth daily. 09/13/18  Yes [provider]  LECITHIN PO Take 1 tablet by mouth daily.   Yes [provider]  levothyroxine (SYNTHROID, LEVOTHROID)  88 MCG tablet Take 88 mcg by mouth daily. 11/22/16  Yes [provider]  loratadine (CLARITIN) 10 MG tablet Take 10 mg by mouth daily as needed for allergies or rhinitis.   Yes [provider]  Magnesium 250 MG TABS Take 250 mg by mouth daily with supper.   Yes [provider]  Meclizine HCl 25 MG CHEW Chew 25 mg by mouth daily as needed (dizziness). 03/01/16  Yes [provider]  Multiple Vitamins-Calcium (ONE-A-DAY WOMENS FORMULA PO) Take 1 tablet by mouth daily with breakfast.   Yes [provider]  omeprazole (PRILOSEC) 20 MG capsule Take 20 mg by mouth daily before breakfast.   Yes [provider]  vitamin C (ASCORBIC ACID) 500 MG tablet Take 500 mg by mouth daily.   Yes [provider]  vitamin E 200 UNIT capsule Take 200 Units by mouth daily.    Yes [provider]  Calcium Carbonate-Vitamin D 600-400 MG-UNIT tablet Take 1 tablet by mouth daily. Patient not taking: Reported on 09/08/2020  [provider]  Cholecalciferol 25 MCG (1000 UT) tablet Take 2,000 Units by mouth daily.  Patient not taking: Reported on 09/08/2020    [provider]  Multiple Vitamin (MULTIVITAMIN WITH MINERALS) TABS tablet Take 1 tablet by mouth daily. Patient not taking: Reported on 09/08/2020    [provider]  vitamin E 180 MG (400 UNITS) capsule Take 400 Units by mouth daily. Patient not taking: No sig reported    [provider]    Physical Exam: Vitals:   09/08/20 1915 09/08/20 1930 09/08/20 1945 09/08/20 2000  BP: (!) 151/80 (!) 151/91 (!) 169/92 (!) 182/93  Pulse: 72 69 76 77  Resp: 17 18 15 17   Temp:      TempSrc:      SpO2: 94% 94% 94% 93%    Constitutional: Awake alert and oriented x3, no associated distress.   Skin: no rashes, no lesions, good skin turgor noted. Eyes: Pupils are equally reactive to light.  No evidence of scleral icterus or conjunctival pallor.  ENMT: Otoscopic exam  revealed cerumen in the left ear canal.  Due to observed cerumen I was unable to evaluate for vesicles.  Moist mucous membranes noted.  Posterior pharynx clear of any exudate or lesions.   Neck: normal, supple, no masses, no thyromegaly.  No evidence of jugular venous distension.   Respiratory: clear to auscultation bilaterally, no wheezing, no crackles. Normal respiratory effort. No accessory muscle use.  Cardiovascular: Regular rate and rhythm, no murmurs / rubs / gallops. No extremity edema. 2+ pedal pulses. No carotid bruits.  Chest:   Nontender without crepitus or deformity.   Back:   Nontender without crepitus or deformity. Abdomen: Abdomen is soft and nontender.  No evidence of intra-abdominal masses.  Positive bowel sounds noted in all quadrants.   Musculoskeletal: No joint deformity upper and lower extremities. Good ROM, no contractures. Normal muscle tone.  Neurologic: Notable left facial weakness which is involved the left eyelid.  This is led to incomplete closure of the left eye.  Sensation intact.  Patient moving all 4 extremities spontaneously.  Patient is following all commands.  Patient is responsive to verbal stimuli.   Psychiatric: Patient exhibits normal mood with appropriate affect.  Patient seems to possess insight as to their current situation.     Labs on Admission: I have personally reviewed following labs and imaging studies -   CBC: Recent Labs  Lab 09/08/20 1715 09/08/20 1725  WBC 7.4  --   NEUTROABS 4.6  --   HGB 14.6 14.3  HCT 43.4 42.0  MCV 84.4  --   PLT 290  --    Basic Metabolic Panel: Recent Labs  Lab 09/08/20 1715 09/08/20 1725  NA 134* 136  K 3.7 4.0  CL 97* 97*  CO2 28  --   GLUCOSE 107* 104*  BUN 14 14  CREATININE 1.09* 1.10*  CALCIUM 9.8  --    GFR: CrCl cannot be calculated (Unknown ideal weight.). Liver Function Tests: Recent Labs  Lab 09/08/20 1715  AST 30  ALT 27  ALKPHOS 46  BILITOT 0.5  PROT 7.6  ALBUMIN 4.1   No  results for input(s): LIPASE, AMYLASE in the last 168 hours. No results for input(s): AMMONIA in the last 168 hours. Coagulation Profile: Recent Labs  Lab 09/08/20 1715  INR 1.0   Cardiac Enzymes: No results for input(s): CKTOTAL, CKMB, CKMBINDEX, TROPONINI in the last 168 hours. BNP (last 3 results) No results for input(s): PROBNP in  the last 8760 hours. HbA1C: No results for input(s): HGBA1C in the last 72 hours. CBG: No results for input(s): GLUCAP in the last 168 hours. Lipid Profile: No results for input(s): CHOL, HDL, LDLCALC, TRIG, CHOLHDL, LDLDIRECT in the last 72 hours. Thyroid Function Tests: No results for input(s): TSH, T4TOTAL, FREET4, T3FREE, THYROIDAB in the last 72 hours. Anemia Panel: No results for input(s): VITAMINB12, FOLATE, FERRITIN, TIBC, IRON, RETICCTPCT in the last 72 hours. Urine analysis:    Component Value Date/Time   COLORURINE YELLOW 09/08/2020 [place] 09/08/2020 1715   LABSPEC 1.009 09/08/2020 1715   PHURINE 7.0 09/08/2020 1715   GLUCOSEU NEGATIVE 09/08/2020 1715   HGBUR NEGATIVE 09/08/2020 1715   BILIRUBINUR NEGATIVE 09/08/2020 [place] 09/08/2020 1715   PROTEINUR NEGATIVE 09/08/2020 1715   NITRITE NEGATIVE 09/08/2020 [place] 09/08/2020 1715    Radiological Exams on Admission - Personally Reviewed: CT HEAD CODE STROKE WO CONTRAST  Result Date: 09/08/2020 CLINICAL DATA:  Code stroke. Left-sided facial droop beginning this afternoon. EXAM: CT HEAD WITHOUT CONTRAST TECHNIQUE: Contiguous axial images were obtained from the base of the skull through the vertex without intravenous contrast. COMPARISON:  08/09/2014 FINDINGS: Brain: Mild age related volume loss. No focal abnormality seen affecting the brainstem or cerebellum. Cerebral hemispheres show minimal small vessel change of the white matter. No cortical or large vessel territory infarction. No mass lesion, hemorrhage, hydrocephalus or  extra-axial collection. Vascular: There is atherosclerotic calcification of the major vessels at the base of the brain. No evidence of hyperdense distal vessel. Skull: Normal Sinuses/Orbits: Clear/normal Other: None ASPECTS ([place] Stroke Program Early CT Score) - Ganglionic level infarction (caudate, lentiform nuclei, internal capsule, insula, M1-M3 cortex): 7 - Supraganglionic infarction (M4-M6 cortex): 3 Total score (0-10 with 10 being normal): 10 IMPRESSION: 1. No acute finding by CT. Mild chronic small-vessel change of the cerebral hemispheric white matter. 2. ASPECTS is 10. 3. These results were communicated to Dr. Cheral Marker at 5:32 pmon 6/15/2022by text page via the [facility] messaging system. Electronically Signed   By: Nelson Chimes M.D.   On: 09/08/2020 17:34    EKG: Personally reviewed.  Rhythm is normal sinus rhythm with heart rate of 83 bpm.  No dynamic ST segment changes appreciated.  Assessment/Plan Principal Problem:   Facial weakness  Patient presenting with sudden onset left facial weakness.  Left eyelid is not spared making stroke relatively unlikely.   That being said, considering patient's numerous risk factors neurology recommended overnight observation with full work-up for possible stroke.  MRI as well as MRA have been ordered.   Serial neurologic checks  Monitoring patient on telemetry  Permissive hypertension overnight until stroke is ruled out via MRI  PT, OT, SLP evaluation  Echocardiogram in the morning which can be canceled if MRI is negative.   If MRI is indeed negative most likely cause of patient's symptoms is either Bell's palsy or Ramsay Hunt syndrome.  Considering patient's complaints of left ear pain over the past several days Bell's palsy is much more likely.  If MRI is negative for acute stroke will initiate a course of valacyclovir and prednisone.    Active Problems:   Essential hypertension  Permissive hypertension for now until stroke is ruled out via MRI     Hypothyroidism (acquired)  Resume home regimen of Synthroid    Mixed hyperlipidemia  Continuing home regimen of lipid lowering therapy.    GERD without esophagitis  Continuing home regimen of daily  PPI therapy.    Stage 3a chronic kidney disease (HCC)  Strict intake and output monitoring Creatinine near baseline Minimizing nephrotoxic agents as much as possible Serial chemistries to monitor renal function and electrolytes    Invasive ductal carcinoma of breast, female, right ([place])  Diagnosed in 2018 Status postlumpectomy 03/2017 Currently in remission per review of outpatient oncology notes Continue outpatient follow-up    Code Status:  Full code Family Communication: Daughter is at bedside who has been updated on plan of care  Status is: Observation  The patient remains OBS appropriate and will d/c before 2 midnights.  Dispo: The patient is from: Home              Anticipated d/c is to: Home              Patient currently is not medically stable to d/c.   Difficult to place patient No        Vernelle Emerald MD [facility] Pager [phone]  If 7PM-7AM, please contact night-coverage [email or web address] Use universal [place] password for that web site. If you do not have the password, please call the hospital operator.  09/08/2020, 9:51 PM

## 2020-09-09 ENCOUNTER — Inpatient Hospital Stay (HOSPITAL_COMMUNITY): Admit: 2020-09-09 | Payer: Medicare Other

## 2020-09-09 ENCOUNTER — Observation Stay (HOSPITAL_BASED_OUTPATIENT_CLINIC_OR_DEPARTMENT_OTHER): Payer: Medicare Other

## 2020-09-09 DIAGNOSIS — G459 Transient cerebral ischemic attack, unspecified: Secondary | ICD-10-CM

## 2020-09-09 DIAGNOSIS — R29810 Facial weakness: Secondary | ICD-10-CM | POA: Diagnosis not present

## 2020-09-09 DIAGNOSIS — G510 Bell's palsy: Secondary | ICD-10-CM | POA: Diagnosis not present

## 2020-09-09 DIAGNOSIS — I6389 Other cerebral infarction: Secondary | ICD-10-CM | POA: Diagnosis not present

## 2020-09-09 LAB — COMPREHENSIVE METABOLIC PANEL
ALT: 25 U/L (ref 0–44)
AST: 32 U/L (ref 15–41)
Albumin: 3.9 g/dL (ref 3.5–5.0)
Alkaline Phosphatase: 45 U/L (ref 38–126)
Anion gap: 9 (ref 5–15)
BUN: 11 mg/dL (ref 8–23)
CO2: 28 mmol/L (ref 22–32)
Calcium: 9.5 mg/dL (ref 8.9–10.3)
Chloride: 98 mmol/L (ref 98–111)
Creatinine, Ser: 1.15 mg/dL — ABNORMAL HIGH (ref 0.44–1.00)
GFR, Estimated: 48 mL/min — ABNORMAL LOW (ref >60)
Glucose, Bld: 131 mg/dL — ABNORMAL HIGH (ref 70–99)
Potassium: 4 mmol/L (ref 3.5–5.1)
Sodium: 135 mmol/L (ref 135–145)
Total Bilirubin: 0.7 mg/dL (ref 0.3–1.2)
Total Protein: 6.8 g/dL (ref 6.5–8.1)

## 2020-09-09 LAB — LIPID PANEL
Cholesterol: 213 mg/dL — ABNORMAL HIGH (ref 0–200)
HDL: 51 mg/dL (ref >40)
LDL Cholesterol: 124 mg/dL — ABNORMAL HIGH (ref 0–99)
Total CHOL/HDL Ratio: 4.2 RATIO
Triglycerides: 188 mg/dL — ABNORMAL HIGH (ref <150)
VLDL: 38 mg/dL (ref 0–40)

## 2020-09-09 LAB — ECHOCARDIOGRAM COMPLETE
AR max vel: 2.07 cm2
AV Area VTI: 2.1 cm2
AV Area mean vel: 2.03 cm2
AV Mean grad: 9 mmHg
AV Peak grad: 16 mmHg
Ao pk vel: 2 m/s
Area-P 1/2: 3.42 cm2
S' Lateral: 2.6 cm

## 2020-09-09 LAB — CBC
HCT: 42.4 % (ref 36.0–46.0)
Hemoglobin: 13.6 g/dL (ref 12.0–15.0)
MCH: 27.6 pg (ref 26.0–34.0)
MCHC: 32.1 g/dL (ref 30.0–36.0)
MCV: 86.2 fL (ref 80.0–100.0)
Platelets: 272 10*3/uL (ref 150–400)
RBC: 4.92 MIL/uL (ref 3.87–5.11)
RDW: 13.5 % (ref 11.5–15.5)
WBC: 7.9 10*3/uL (ref 4.0–10.5)
nRBC: 0 % (ref 0.0–0.2)

## 2020-09-09 MED ORDER — ATENOLOL 25 MG PO TABS
25.0000 mg | ORAL_TABLET | Freq: Every day | ORAL | Status: DC
  Administered 2020-09-09: 25 mg via ORAL
  Filled 2020-09-09: qty 1

## 2020-09-09 MED ORDER — VALACYCLOVIR HCL 500 MG PO TABS
1000.0000 mg | ORAL_TABLET | Freq: Three times a day (TID) | ORAL | Status: DC
  Administered 2020-09-09 (×3): 1000 mg via ORAL
  Filled 2020-09-09 (×4): qty 2

## 2020-09-09 MED ORDER — PREDNISONE 20 MG PO TABS: 60.0000 mg | ORAL_TABLET | Freq: Every day | ORAL | Status: DC

## 2020-09-09 MED ORDER — HYDRALAZINE HCL 20 MG/ML IJ SOLN
10.0000 mg | INTRAMUSCULAR | Status: DC | PRN
  Administered 2020-09-09: 10 mg via INTRAVENOUS
  Filled 2020-09-09: qty 1

## 2020-09-09 MED ORDER — ACETAMINOPHEN 325 MG PO TABS: 650.0000 mg | ORAL_TABLET | ORAL | 0 refills | Status: AC | PRN

## 2020-09-09 MED ORDER — METHYLPREDNISOLONE 4 MG PO TBPK: ORAL_TABLET | ORAL | 0 refills | Status: DC

## 2020-09-09 MED ORDER — PREDNISONE 50 MG PO TABS
75.0000 mg | ORAL_TABLET | Freq: Every day | ORAL | Status: DC
  Administered 2020-09-09: 75 mg via ORAL
  Filled 2020-09-09: qty 1.5

## 2020-09-09 MED ORDER — VALACYCLOVIR HCL 1 G PO TABS: 1000.0000 mg | ORAL_TABLET | Freq: Three times a day (TID) | ORAL | 0 refills | Status: AC

## 2020-09-09 MED ORDER — EZETIMIBE 10 MG PO TABS
10.0000 mg | ORAL_TABLET | Freq: Every day | ORAL | Status: DC
  Administered 2020-09-09: 10 mg via ORAL
  Filled 2020-09-09: qty 1

## 2020-09-09 NOTE — Progress Notes (Signed)
I provided the patient with full discharge instructions regarding her condition, medications, and next steps regarding treatment.  All questions answered to patient's satisfaction.  Vital signs stable upon discharge, as charted.  I took the patient to daughters car at emergency department, and helped her into car.

## 2020-09-09 NOTE — Procedures (Addendum)
Patient Name: Claire Carlson  MRN: [id number]  Epilepsy Attending: Lora Havens  Referring Physician/Provider: Dr Kerney Elbe Duration: 09/08/2020 2337 to 09/09/2020  [id number], [phone]  Patient history: 80 y.o. female presenting after acute onset of left facial droop and left tongue paresthesia at home. EEG to evaluate for seizure  Level of alertness: Awake, asleep  AEDs during EEG study: None  Technical aspects: This EEG study was done with scalp electrodes positioned according to the 10-20 International system of electrode placement. Electrical activity was acquired at a sampling rate of 500Hz  and reviewed with a high frequency filter of 70Hz  and a low frequency filter of 1Hz . EEG data were recorded continuously and digitally stored.   Description: The posterior dominant rhythm consists of 10 Hz activity of moderate voltage (25-35 uV) seen predominantly in posterior head regions, symmetric and reactive to eye opening and eye closing. Sleep was characterized by vertex waves, sleep spindles (12 to 14 Hz), maximal frontocentral region. Hyperventilation and photic stimulation were not performed.     IMPRESSION: This study is within normal limits. No seizures or epileptiform discharges were seen throughout the recording.  Larone Kliethermes Barbra Sarks

## 2020-09-09 NOTE — Progress Notes (Signed)
Carotid artery duplex completed. Refer to "CV Proc" under chart review to view preliminary results.  09/09/2020 1:44 PM Kelby Aline., MHA, RVT, RDCS, RDMS

## 2020-09-09 NOTE — Progress Notes (Signed)
Patient is admitted and oriented to the room.

## 2020-09-09 NOTE — ED Notes (Signed)
Tele  Breakfast Ordered 

## 2020-09-09 NOTE — Evaluation (Signed)
Physical Therapy Evaluation and Discharge  Patient Details Name: Claire Carlson MRN: [id number] DOB: [birth year] Today's Date: 09/09/2020   History of Present Illness  Pt is an 80 y/o female admitted secondary to L facial droop. MRI negative. Per notes likely Ramsay Hunt syndrome vs Bell's Palsy. PMH includes HTN, CKD, R breast cancer, and vertigo.  Clinical Impression  Patient evaluated by Physical Therapy with no further acute PT needs identified. All education has been completed and the patient has no further questions. Pt overall steady and at an independent level with mobility tasks. No overt LOB noted when performing DGI tasks. Reports her daughter can assist as needed at d/c. See below for any follow-up Physical Therapy or equipment needs. PT is signing off. Thank you for this referral. If needs change, please re-consult.      Follow Up Recommendations No PT follow up    Equipment Recommendations  None recommended by PT    Recommendations for Other Services       Precautions / Restrictions Precautions Precautions: None Restrictions Weight Bearing Restrictions: No      Mobility  Bed Mobility Overal bed mobility: Independent                  Transfers Overall transfer level: Independent                  Ambulation/Gait Ambulation/Gait assistance: Independent Gait Distance (Feet): 120 Feet Assistive device: None Gait Pattern/deviations: WFL(Within Functional Limits) Gait velocity: WFL   General Gait Details: Overall steady gait. Able to perform DGI tasks without LOB.  Stairs            Wheelchair Mobility    Modified Rankin (Stroke Patients Only)       Balance Overall balance assessment: Independent                               Standardized Balance Assessment Standardized Balance Assessment : Dynamic Gait Index   Dynamic Gait Index Level Surface: Normal Change in Gait Speed: Normal Gait with Horizontal Head Turns:  Normal Gait with Vertical Head Turns: Normal Gait and Pivot Turn: Normal Step Over Obstacle: Normal Step Around Obstacles: Normal       Pertinent Vitals/Pain Pain Assessment: No/denies pain    Home Living Family/patient expects to be discharged to:: Private residence Living Arrangements: Spouse/significant other Available Help at Discharge: Family Type of Home: House Home Access: Stairs to enter Entrance Stairs-Rails: None Entrance Stairs-Number of Steps: 2 Home Layout: One level Home Equipment: Environmental consultant - 2 wheels;Shower seat;Grab bars - tub/shower;Grab bars - toilet Additional Comments: 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 Reports daughter can assist if needed with caregiving duties.    Prior Function Level of Independence: Independent         Comments: Is the caregiver for her husband     Hand Dominance        Extremity/Trunk Assessment   Upper Extremity Assessment Upper Extremity Assessment: Overall WFL for tasks assessed    Lower Extremity Assessment Lower Extremity Assessment: Overall WFL for tasks assessed    Cervical / Trunk Assessment Cervical / Trunk Assessment: Normal  Communication   Communication: No difficulties  Cognition Arousal/Alertness: Awake/alert Behavior During Therapy: WFL for tasks assessed/performed Overall Cognitive Status: Within Functional Limits for tasks assessed  General Comments      Exercises     Assessment/Plan    PT Assessment Patent does not need any further PT services  PT Problem List         PT Treatment Interventions      PT Goals (Current goals can be found in the Care Plan section)  Acute Rehab PT Goals Patient Stated Goal: to go home PT Goal Formulation: With patient Time For Goal Achievement: 09/09/20 Potential to Achieve Goals: Good    Frequency     Barriers to discharge        Co-evaluation               AM-PAC PT "6 Clicks"  Mobility  Outcome Measure Help needed turning from your back to your side while in a flat bed without using bedrails?: None Help needed moving from lying on your back to sitting on the side of a flat bed without using bedrails?: None Help needed moving to and from a bed to a chair (including a wheelchair)?: None Help needed standing up from a chair using your arms (e.g., wheelchair or bedside chair)?: None Help needed to walk in hospital room?: None Help needed climbing 3-5 steps with a railing? : None 6 Click Score: 24    End of Session Equipment Utilized During Treatment: Gait belt Activity Tolerance: Patient tolerated treatment well Patient left: in bed;with call bell/phone within reach;with family/visitor present (on stretcher in ED) Nurse Communication: Mobility status PT Visit Diagnosis: Other symptoms and signs involving the nervous system (R29.898)    Time: 4431-5400 PT Time Calculation (min) (ACUTE ONLY): 17 min   Charges:   PT Evaluation $PT Eval Low Complexity: 1 Low          Lou Miner, DPT  Acute Rehabilitation Services  Pager: [phone] Office: [phone]   Rudean Hitt 09/09/2020, 2:53 PM

## 2020-09-09 NOTE — Plan of Care (Signed)

## 2020-09-09 NOTE — Progress Notes (Signed)
Attempted carotid artery duplex, however patient is getting EEG. Will attempt again as schedule permits.  09/09/2020 9:49 AM Kelby Aline., MHA, RVT, RDCS, RDMS

## 2020-09-09 NOTE — Discharge Summary (Signed)
Physician Discharge Summary [facility]    Patient: Claire Carlson                   Admit date: 09/08/2020   DOB: 10/25/1940             Discharge date:09/09/2020/2:19 PM DTO:[id number]                          PCP: Raina Mina., MD  Disposition: HOME  Recommendations for Outpatient Follow-up:   Follow up: With PCP in 1 week Continue current prescribed medication acyclovir and prednisone  Discharge Condition: Stable   Code Status:   Code Status: Full Code  Diet recommendation: Healthy cardiac diet   Discharge Diagnoses:    Principal Problem:   Facial weakness Active Problems:   Essential hypertension   Hypothyroidism (acquired)   Mixed hyperlipidemia   GERD without esophagitis   Stage 3a chronic kidney disease ([place])   Invasive ductal carcinoma of breast, female, right ([place])   History of Present Illness/ Hospital Course Claire Carlson Summary:   80 year old female with past medical history of right breast cancer (S/P lumpectomy/chemo), hypertension, anxiety disorder, hypercalcemia, hypothyroidism, hyperlipidemia, prediabetes, vitamin D deficiency, GERD with Barrett's esophagu who presents to [facility] emergency department with complaints of left facial droop.   Patient explains that approximately 12:00 earlier today she was eating lunch with her husband when suddenly she realized she was having some difficulty chewing her food.  Her husband then realized that her left face was drooping.  Patient went to the bathroom and confirm this in the mirror, realizing that in addition to left facial droop she was having difficulty closing her left eye.   Upon further questioning patient denies headaches, loss of balance, focal weakness, slurred speech or changes in vision.  Patient does admit to a several day history of left ear pain and occipital headaches over the past several days however.   Patient symptoms persisted for several hours until patient eventually  presented to [facility] emergency department for evaluation.   Upon evaluation in the emergency department patient was promptly evaluated by neurology and was recommended the patient be observed with work-up to rule out an underlying stroke including MR imaging of the brain.  CT imaging of the head without contrast was already performed revealing no evidence of acute stroke.   The hospitalist group was then called to assess the patient for admission to the hospital.   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 Since work-up has been negative, patient has been cleared to be discharged home with p.o. acyclovir and prednisone taper  Comorbidities were addressed, hypertension, hypothyroidism, hyperlipidemia, GERD, stage III chronic kidney disease, history of breast cancer-Home medication reviewed resumed with no further changes  Discharge Instructions:   Discharge Instructions     Activity as tolerated - No restrictions   Complete by: As directed    Ambulatory referral to Neurology   Complete by: As directed    Follow up with stroke clinic NP (Jessica Vanschaick or Cecille Rubin, if both not available, consider Zachery Dauer, or Ahern) at [facility] in about 4 weeks. Thanks.   Call MD for:  difficulty breathing, headache or visual disturbances   Complete by: As directed    Call MD for:  persistant dizziness or light-headedness   Complete by: As directed    Call MD for:  persistant nausea and vomiting   Complete by: As directed    Call MD for:  redness, tenderness, or signs of infection (pain, swelling, redness, odor or green/yellow discharge around incision site)   Complete by: As directed    Call MD for:  severe uncontrolled pain   Complete by: As directed     Call MD for:  temperature >100.4   Complete by: As directed    Diet - low sodium heart healthy   Complete by: As directed    Discharge instructions   Complete by: As directed    Monitor your symptoms closely, stroke was ruled out, this is consistent with Ramsay Hunt syndrome Continue valacyclovir and prednisone taper Follow-up with PCP in 1 week   Increase activity slowly   Complete by: As directed         Medication List     STOP taking these medications    Calcium Carbonate-Vitamin D 600-400 MG-UNIT tablet   multivitamin with minerals Tabs tablet       TAKE these medications    acetaminophen 325 MG tablet Commonly known as: TYLENOL Take 2 tablets (650 mg total) by mouth every 4 (four) hours as needed for mild pain (or temp > 37.5 C (99.5 F)).   atenolol 25 MG tablet Commonly known as: TENORMIN Take 12.5 mg by mouth in the morning.   CINNAMON PO Take 200 mg by mouth daily.   CITRACAL +D3 PO Take 1 tablet by mouth daily.   hydrochlorothiazide 25 MG tablet Commonly known as: HYDRODIURIL Take 25 mg by mouth daily.   LECITHIN PO Take 1 tablet by mouth daily.   levothyroxine 88 MCG tablet Commonly known as: SYNTHROID Take 88 mcg by mouth daily.   loratadine 10 MG tablet Commonly known as: CLARITIN Take 10 mg by mouth daily as needed for allergies or rhinitis.   Magnesium 250 MG Tabs Take 250 mg by mouth daily with supper.   Meclizine HCl 25 MG Chew Chew 25 mg by mouth daily as needed (dizziness).   methylPREDNISolone 4 MG Tbpk tablet Commonly known as: MEDROL DOSEPAK Medrol Dosepak take as instructed   omeprazole 20 MG capsule Commonly known as: PRILOSEC Take 20 mg by mouth daily before breakfast.   ONE-A-DAY WOMENS FORMULA PO Take 1 tablet by mouth daily with breakfast.   valACYclovir 1000 MG tablet Commonly known as: VALTREX Take 1 tablet (1,000 mg total) by mouth 3 (three) times daily for 7 days.   vitamin C 500 MG tablet Commonly known  as: ASCORBIC ACID Take 500 mg by mouth daily.   Vitamin D3 50 MCG (2000 UT) Tabs Take 2,000 Units by mouth in the morning. What changed: Another medication with the same name was removed. Continue taking this medication, and follow the directions you see here.   vitamin E 200 UNIT capsule Take 200 Units by mouth daily. What changed: Another medication with the same name was removed. Continue taking this medication, and follow the directions you see here.       ASK your doctor about these medications    anastrozole 1 MG tablet Commonly known as: ARIMIDEX Take 1 tablet (1 mg total) by mouth daily.        Follow-up Information     [facility]. Schedule an appointment as soon as possible for a visit in 1 month(s).   Specialty: Neurology Contact information: [address]  [address] [place] [address] [phone]               Allergies  Allergen Reactions   Penicillin G Other (See Comments)    Welts   Rosuvastatin Other (See Comments)    Cramping of the legs   Statins Other (See Comments)    Cramping of the legs   Tape     Some tapes break out the skin- Paper tape is tolerated   Biaxin [Clarithromycin] Rash     Procedures /Studies:   MR ANGIO HEAD WO CONTRAST  Result Date: 09/08/2020 CLINICAL DATA:  Stroke follow-up EXAM: MRA HEAD WITHOUT CONTRAST TECHNIQUE: Angiographic images of the Circle of Willis were acquired using MRA technique without intravenous contrast. COMPARISON:  No pertinent prior exam. FINDINGS: POSTERIOR CIRCULATION: --Vertebral arteries: Normal --Inferior cerebellar arteries: Normal. --Basilar artery: Normal. --Superior cerebellar arteries: Normal. --Posterior cerebral arteries: Normal. ANTERIOR CIRCULATION: --Intracranial internal carotid arteries: Normal. --Anterior cerebral arteries (ACA): Normal. --Middle cerebral arteries (MCA): Normal. ANATOMIC VARIANTS: Fetal origin of the right PCA. IMPRESSION: Normal  intracranial MRA. Electronically Signed   By: Ulyses Jarred M.D.   On: 09/08/2020 22:08   MR BRAIN WO CONTRAST  Result Date: 09/08/2020 CLINICAL DATA:  Stroke follow-up EXAM: MRI HEAD WITHOUT CONTRAST TECHNIQUE: Multiplanar, multiecho pulse sequences of the brain and surrounding structures were obtained without intravenous contrast. COMPARISON:  None. FINDINGS: Brain: No acute infarct, mass effect or extra-axial collection. No acute or chronic hemorrhage. There is multifocal hyperintense T2-weighted signal within the white matter. Generalized volume loss without a clear lobar predilection. The midline structures are normal. Vascular: Major flow voids are preserved. Skull and upper cervical spine: Normal calvarium and skull base. Visualized upper cervical spine and soft tissues are normal. Sinuses/Orbits:No paranasal sinus fluid levels or advanced mucosal thickening. No mastoid or middle ear effusion. Normal orbits. IMPRESSION: 1. No acute intracranial abnormality. 2. Findings of chronic small vessel ischemia and generalized volume loss. Electronically Signed   By: Ulyses Jarred M.D.   On: 09/08/2020 22:20   Overnight EEG with video  Result Date: 09/09/2020 Lora Havens, MD     09/09/2020  9:14 AM Patient Name: Bridgitt Raggio MRN: [id number] Epilepsy Attending: Lora Havens Referring Physician/Provider: Dr Kerney Elbe Duration: 09/08/2020 2337 to 09/09/2020  7408, [id number] Patient history: 80 y.o. female presenting after acute onset of left facial droop and left tongue paresthesia at home. EEG to evaluate for seizure Level of alertness: Awake, asleep AEDs during EEG study: None Technical aspects: This EEG study was done with scalp electrodes positioned according to the 10-20 International system of electrode placement. Electrical activity was acquired at a sampling rate of 500Hz  and reviewed with a high frequency filter of 70Hz  and a low frequency filter of 1Hz . EEG data were recorded continuously  and digitally stored. Description: The posterior dominant rhythm consists of 10 Hz activity of moderate voltage (25-35 uV) seen predominantly in posterior head regions, symmetric and reactive to eye opening and eye closing. Sleep was characterized by vertex waves, sleep spindles (12 to 14 Hz), maximal frontocentral region. Hyperventilation and photic stimulation were not performed.   IMPRESSION: This study is within normal limits. No seizures or epileptiform discharges were seen throughout the recording. Lora Havens   CT HEAD CODE STROKE WO CONTRAST  Result Date: 09/08/2020 CLINICAL DATA:  Code stroke. Left-sided facial droop beginning this afternoon. EXAM: CT HEAD WITHOUT CONTRAST TECHNIQUE: Contiguous axial images were obtained from the base of the skull through the vertex  without intravenous contrast. COMPARISON:  08/09/2014 FINDINGS: Brain: Mild age related volume loss. No focal abnormality seen affecting the brainstem or cerebellum. Cerebral hemispheres show minimal small vessel change of the white matter. No cortical or large vessel territory infarction. No mass lesion, hemorrhage, hydrocephalus or extra-axial collection. Vascular: There is atherosclerotic calcification of the major vessels at the base of the brain. No evidence of hyperdense distal vessel. Skull: Normal Sinuses/Orbits: Clear/normal Other: None ASPECTS ([place] Stroke Program Early CT Score) - Ganglionic level infarction (caudate, lentiform nuclei, internal capsule, insula, M1-M3 cortex): 7 - Supraganglionic infarction (M4-M6 cortex): 3 Total score (0-10 with 10 being normal): 10 IMPRESSION: 1. No acute finding by CT. Mild chronic small-vessel change of the cerebral hemispheric white matter. 2. ASPECTS is 10. 3. These results were communicated to Dr. Cheral Marker at 5:32 pmon 6/15/2022by text page via the [facility] messaging system. Electronically Signed   By: Nelson Chimes M.D.   On: 09/08/2020 17:34   VAS US CAROTID  Result Date:  09/09/2020 Carotid Arterial Duplex Study Patient Name:  CURLEY FAYETTE  Date of Exam:   09/09/2020 Medical Rec #: [id number]          Accession #:    [id number] Date of Birth: Sep 20, 1940          Patient Gender: F Patient Age:   80Y Exam Location:  [facility] Procedure:      VAS US CAROTID Referring Phys: [id number] Rosalin Hawking --------------------------------------------------------------------------------  Indications:  TIA. Risk Factors: Hypertension, hyperlipidemia. Performing Technologist: Maudry Mayhew MHA, RDMS, RVT, RDCS  Examination Guidelines: A complete evaluation includes B-mode imaging, spectral Doppler, color Doppler, and power Doppler as needed of all accessible portions of each vessel. Bilateral testing is considered an integral part of a complete examination. Limited examinations for reoccurring indications may be performed as noted.  Right Carotid Findings: +----------+--------+--------+--------+-------------------------+--------+           PSV cm/sEDV cm/sStenosisPlaque Description       Comments +----------+--------+--------+--------+-------------------------+--------+ CCA Prox  87      16                                                +----------+--------+--------+--------+-------------------------+--------+ CCA Distal59      11                                                +----------+--------+--------+--------+-------------------------+--------+ ICA Prox  69      18              heterogenous and calcific         +----------+--------+--------+--------+-------------------------+--------+ ICA Distal91      24                                                +----------+--------+--------+--------+-------------------------+--------+ ECA       134     21                                                +----------+--------+--------+--------+-------------------------+--------+ +----------+--------+-------+----------------+-------------------+  PSV cm/sEDV cmsDescribe        Arm Pressure (mmHG) +----------+--------+-------+----------------+-------------------+ [id number]             Multiphasic, WNL                    +----------+--------+-------+----------------+-------------------+ +---------+--------+--------+--------------+ VertebralPSV cm/sEDV cm/sNot identified +---------+--------+--------+--------------+  Left Carotid Findings: +----------+--------+--------+--------+--------------------------+--------+           PSV cm/sEDV cm/sStenosisPlaque Description        Comments +----------+--------+--------+--------+--------------------------+--------+ CCA Prox  97      20                                                 +----------+--------+--------+--------+--------------------------+--------+ CCA Distal85      19                                                 +----------+--------+--------+--------+--------------------------+--------+ ICA Prox  69      15                                                 +----------+--------+--------+--------+--------------------------+--------+ ICA Distal43      10                                                 +----------+--------+--------+--------+--------------------------+--------+ ECA       91      15              irregular and heterogenous         +----------+--------+--------+--------+--------------------------+--------+ +----------+--------+--------+----------------+-------------------+           PSV cm/sEDV cm/sDescribe        Arm Pressure (mmHG) +----------+--------+--------+----------------+-------------------+ [id number]              Multiphasic, WNL                    +----------+--------+--------+----------------+-------------------+ +---------+--------+--------+--------------+ VertebralPSV cm/sEDV cm/sNot identified +---------+--------+--------+--------------+   Summary: Right Carotid: Velocities in the right ICA are consistent  with a 1-39% stenosis. Left Carotid: Velocities in the left ICA are consistent with a 1-39% stenosis. Vertebrals:  Bilateral vertebral arteries were not visualized. Subclavians: Normal flow hemodynamics were seen in bilateral subclavian              arteries. *See table(s) above for measurements and observations.  Electronically signed by Jamelle Haring on 09/09/2020 at 2:17:46 PM.    Final     Subjective:   Patient was seen and examined 09/09/2020, 2:19 PM Patient stable today. No acute distress.  No issues overnight Stable for discharge.  Discharge Exam:    Vitals:   09/09/20 0723 09/09/20 1000 09/09/20 1100 09/09/20 1319  BP: (!) 179/88 (!) 162/82 (!) 179/91   Pulse: 69 77 75   Resp: 16 20 15    Temp: 98.1 F (36.7 C)   98.2 F (36.8 C)  TempSrc:    Oral  SpO2: 98% 94% 95%     General: Pt lying  comfortably in bed & appears in no obvious distress. Cardiovascular: S1 & S2 heard, RRR, S1/S2 +. No murmurs, rubs, gallops or clicks. No JVD or pedal edema. Respiratory: Clear to auscultation without wheezing, rhonchi or crackles. No increased work of breathing. Abdominal:  Non-distended, non-tender & soft. No organomegaly or masses appreciated. Normal bowel sounds heard. CNS: Alert and oriented. No focal deficits. Extremities: no edema, no cyanosis      The results of significant diagnostics from this hospitalization (including imaging, microbiology, ancillary and laboratory) are listed below for reference.      Microbiology:   Recent Results (from the past 240 hour(s))  Resp Panel by RT-PCR (Flu A&B, Covid) Nasopharyngeal Swab     Status: None   Collection Time: 09/08/20  6:00 PM   Specimen: Nasopharyngeal Swab; Nasopharyngeal(NP) swabs in vial transport medium  Result Value Ref Range Status   SARS Coronavirus 2 by RT PCR NEGATIVE NEGATIVE Final    Comment: (NOTE) SARS-CoV-2 target nucleic acids are NOT DETECTED.  The SARS-CoV-2 RNA is generally detectable in upper  respiratory specimens during the acute phase of infection. The lowest concentration of SARS-CoV-2 viral copies this assay can detect is 138 copies/mL. A negative result does not preclude SARS-Cov-2 infection and should not be used as the sole basis for treatment or other patient management decisions. A negative result may occur with  improper specimen collection/handling, submission of specimen other than nasopharyngeal swab, presence of viral mutation(s) within the areas targeted by this assay, and inadequate number of viral copies(<138 copies/mL). A negative result must be combined with clinical observations, patient history, and epidemiological information. The expected result is Negative.  Fact Sheet for Patients:  EntrepreneurPulse.com.au  Fact Sheet for Healthcare Providers:  IncredibleEmployment.be  This test is no t yet approved or cleared by the [place] FDA and  has been authorized for detection and/or diagnosis of SARS-CoV-2 by FDA under an Emergency Use Authorization (EUA). This EUA will remain  in effect (meaning this test can be used) for the duration of the COVID-19 declaration under Section 564(b)(1) of the Act, 21 U.S.C.section 360bbb-3(b)(1), unless the authorization is terminated  or revoked sooner.       Influenza A by PCR NEGATIVE NEGATIVE Final   Influenza B by PCR NEGATIVE NEGATIVE Final    Comment: (NOTE) The Xpert Xpress SARS-CoV-2/FLU/RSV plus assay is intended as an aid in the diagnosis of influenza from Nasopharyngeal swab specimens and should not be used as a sole basis for treatment. Nasal washings and aspirates are unacceptable for Xpert Xpress SARS-CoV-2/FLU/RSV testing.  Fact Sheet for Patients: EntrepreneurPulse.com.au  Fact Sheet for Healthcare Providers: IncredibleEmployment.be  This test is not yet approved or cleared by the [place] FDA and has been  authorized for detection and/or diagnosis of SARS-CoV-2 by FDA under an Emergency Use Authorization (EUA). This EUA will remain in effect (meaning this test can be used) for the duration of the COVID-19 declaration under Section 564(b)(1) of the Act, 21 U.S.C. section 360bbb-3(b)(1), unless the authorization is terminated or revoked.  Performed at [facility], [place] [address] [place], [place] [address]      Labs:   CBC: Recent Labs  Lab 09/08/20 1715 09/08/20 1725 09/09/20 0203  WBC 7.4  --  7.9  NEUTROABS 4.6  --   --   HGB 14.6 14.3 13.6  HCT 43.4 42.0 42.4  MCV 84.4  --  86.2  PLT 290  --  924   Basic Metabolic Panel: Recent Labs  Lab 09/08/20 1715 09/08/20 1725 09/09/20 0203  NA 134* 136 135  K 3.7 4.0 4.0  CL 97* 97* 98  CO2 28  --  28  GLUCOSE 107* 104* 131*  BUN 14 14 11   CREATININE 1.09* 1.10* 1.15*  CALCIUM 9.8  --  9.5   Liver Function Tests: Recent Labs  Lab 09/08/20 1715 09/09/20 0203  AST 30 32  ALT 27 25  ALKPHOS 46 45  BILITOT 0.5 0.7  PROT 7.6 6.8  ALBUMIN 4.1 3.9   BNP (last 3 results) No results for input(s): BNP in the last 8760 hours. Cardiac Enzymes: No results for input(s): CKTOTAL, CKMB, CKMBINDEX, TROPONINI in the last 168 hours. CBG: No results for input(s): GLUCAP in the last 168 hours. Hgb A1c No results for input(s): HGBA1C in the last 72 hours. Lipid Profile Recent Labs    09/09/20 0203  CHOL 213*  HDL 51  LDLCALC 124*  TRIG 188*  CHOLHDL 4.2   Thyroid function studies No results for input(s): TSH, T4TOTAL, T3FREE, THYROIDAB in the last 72 hours.  Invalid input(s): FREET3 Anemia work up No results for input(s): VITAMINB12, FOLATE, FERRITIN, TIBC, IRON, RETICCTPCT in the last 72 hours. Urinalysis    Component Value Date/Time   COLORURINE YELLOW 09/08/2020 1715   APPEARANCEUR CLEAR 09/08/2020 1715   LABSPEC 1.009 09/08/2020 [place] 7.0 09/08/2020 [place] 09/08/2020 [place] 09/08/2020 [place] 09/08/2020 [place] 09/08/2020 [place] 09/08/2020 1715   NITRITE NEGATIVE 09/08/2020 [place] 09/08/2020 1715         Time coordinating discharge: Over 45 minutes  SIGNED: Deatra James, MD, FACP, FHM. [facility],  Please use [email or web address] to Page If 7PM-7AM, please contact night-coverage [email or web address] [facility] 09/09/2020, 2:19 PM

## 2020-09-09 NOTE — Progress Notes (Signed)
LTM maintenanced; fixed several leads in both the headbox and on the patient; no skin breakdoan was seen.

## 2020-09-09 NOTE — ED Notes (Signed)
EEG machine locked at this time, pt called EEG multiple times without answer to be made aware.

## 2020-09-09 NOTE — Progress Notes (Signed)
OT Cancellation Note  Patient Details Name: Claire Carlson MRN: [id number] DOB: 05-Dec-1940   Cancelled Treatment:    Reason Eval/Treat Not Completed: OT screened, no needs identified, will sign off.  Pt with negative MRI.  Spoke with PT and pt seems back to baseline.  [name]., OTR/L Acute Rehabilitation Services Pager [phone] Office [phone]   [name] M 09/09/2020, 3:12 PM

## 2020-09-09 NOTE — Progress Notes (Signed)
LTM discontinued; no skin breakdown was seen. 

## 2020-09-09 NOTE — Progress Notes (Signed)
PT Cancellation Note  Patient Details Name: Claire Carlson MRN: [id number] DOB: [birth year]   Cancelled Treatment:    Reason Eval/Treat Not Completed: Patient at procedure or test/unavailable Pt currently having EEG. Will follow up as schedule allows.   Lou Miner, DPT  Acute Rehabilitation Services  Pager: [phone] Office: [phone]    Rudean Hitt 09/09/2020, 9:27 AM

## 2020-09-09 NOTE — ED Notes (Signed)
Nurse report given to Caryl Pina, [place]

## 2020-09-09 NOTE — Progress Notes (Signed)
OT Cancellation Note  Patient Details Name: Claire Carlson MRN: [id number] DOB: 16-Mar-1941   Cancelled Treatment:    Reason Eval/Treat Not Completed: Patient at procedure or test/ unavailable.  Pt undergoing EEG.  [name]., OTR/L Acute Rehabilitation Services Pager [phone] Office [phone]   [name] M 09/09/2020, 12:04 PM

## 2020-09-09 NOTE — Progress Notes (Signed)
STROKE TEAM PROGRESS NOTE   SUBJECTIVE (INTERVAL HISTORY) Her EEG tech is at the bedside to remove LTM EEG.  Overall her condition is stable. She stated that she had some head pain at left back of head and neck for 2 days. Yesterday she felt left tongue tingling and she looked at mirror felt left facial droop. She went to urgent care and transferred to [facility]. CT, MRI and MRA head neg. Found to have incomplete left eye closure, concerning for bell's palsy and started on prednisone and valtrex. EEG normal. Pending TTE and CUS. Pt denies hearing change, taste change or dry eye on the left but she said yesterday she had transient tearing at left eye  OBJECTIVE Temp:  [98.1 F (36.7 C)-98.7 F (37.1 C)] 98.1 F (36.7 C) (06/16 0723) Pulse Rate:  [58-84] 75 (06/16 1100) Resp:  [12-20] 15 (06/16 1100) BP: (151-195)/(70-99) 179/91 (06/16 1100) SpO2:  [90 %-99 %] 95 % (06/16 1100)  No results for input(s): GLUCAP in the last 168 hours. Recent Labs  Lab 09/08/20 1715 09/08/20 1725 09/09/20 0203  NA 134* 136 135  K 3.7 4.0 4.0  CL 97* 97* 98  CO2 28  --  28  GLUCOSE 107* 104* 131*  BUN 14 14 11   CREATININE 1.09* 1.10* 1.15*  CALCIUM 9.8  --  9.5   Recent Labs  Lab 09/08/20 1715 09/09/20 0203  AST 30 32  ALT 27 25  ALKPHOS 46 45  BILITOT 0.5 0.7  PROT 7.6 6.8  ALBUMIN 4.1 3.9   Recent Labs  Lab 09/08/20 1715 09/08/20 1725 09/09/20 0203  WBC 7.4  --  7.9  NEUTROABS 4.6  --   --   HGB 14.6 14.3 13.6  HCT 43.4 42.0 42.4  MCV 84.4  --  86.2  PLT 290  --  272   No results for input(s): CKTOTAL, CKMB, CKMBINDEX, TROPONINI in the last 168 hours. Recent Labs    09/08/20 1715  LABPROT 12.9  INR 1.0   Recent Labs    09/08/20 1715  COLORURINE YELLOW  LABSPEC 1.009  PHURINE 7.0  GLUCOSEU NEGATIVE  HGBUR NEGATIVE  BILIRUBINUR NEGATIVE  KETONESUR NEGATIVE  PROTEINUR NEGATIVE  NITRITE NEGATIVE  LEUKOCYTESUR NEGATIVE       Component Value Date/Time   CHOL 213 (H)  09/09/2020 0203   CHOL 181 07/31/2018 0928   TRIG 188 (H) 09/09/2020 0203   HDL 51 09/09/2020 0203   HDL 55 07/31/2018 0928   CHOLHDL 4.2 09/09/2020 0203   VLDL 38 09/09/2020 0203   LDLCALC 124 (H) 09/09/2020 0203   LDLCALC 95 07/31/2018 0928   No results found for: HGBA1C    Component Value Date/Time   LABOPIA NONE DETECTED 09/08/2020 1715   COCAINSCRNUR NONE DETECTED 09/08/2020 1715   LABBENZ NONE DETECTED 09/08/2020 1715   AMPHETMU NONE DETECTED 09/08/2020 1715   THCU NONE DETECTED 09/08/2020 1715   LABBARB NONE DETECTED 09/08/2020 1715    Recent Labs  Lab 09/08/20 1715  ETH <10    I have personally reviewed the radiological images below and agree with the radiology interpretations.  MR ANGIO HEAD WO CONTRAST  Result Date: 09/08/2020 CLINICAL DATA:  Stroke follow-up EXAM: MRA HEAD WITHOUT CONTRAST TECHNIQUE: Angiographic images of the Circle of Willis were acquired using MRA technique without intravenous contrast. COMPARISON:  No pertinent prior exam. FINDINGS: POSTERIOR CIRCULATION: --Vertebral arteries: Normal --Inferior cerebellar arteries: Normal. --Basilar artery: Normal. --Superior cerebellar arteries: Normal. --Posterior cerebral arteries: Normal. ANTERIOR CIRCULATION: --Intracranial internal  carotid arteries: Normal. --Anterior cerebral arteries (ACA): Normal. --Middle cerebral arteries (MCA): Normal. ANATOMIC VARIANTS: Fetal origin of the right PCA. IMPRESSION: Normal intracranial MRA. Electronically Signed   By: Ulyses Jarred M.D.   On: 09/08/2020 22:08   MR BRAIN WO CONTRAST  Result Date: 09/08/2020 CLINICAL DATA:  Stroke follow-up EXAM: MRI HEAD WITHOUT CONTRAST TECHNIQUE: Multiplanar, multiecho pulse sequences of the brain and surrounding structures were obtained without intravenous contrast. COMPARISON:  None. FINDINGS: Brain: No acute infarct, mass effect or extra-axial collection. No acute or chronic hemorrhage. There is multifocal hyperintense T2-weighted signal  within the white matter. Generalized volume loss without a clear lobar predilection. The midline structures are normal. Vascular: Major flow voids are preserved. Skull and upper cervical spine: Normal calvarium and skull base. Visualized upper cervical spine and soft tissues are normal. Sinuses/Orbits:No paranasal sinus fluid levels or advanced mucosal thickening. No mastoid or middle ear effusion. Normal orbits. IMPRESSION: 1. No acute intracranial abnormality. 2. Findings of chronic small vessel ischemia and generalized volume loss. Electronically Signed   By: Ulyses Jarred M.D.   On: 09/08/2020 22:20   Overnight EEG with video  Result Date: 09/09/2020 Lora Havens, MD     09/09/2020  9:14 AM Patient Name: Claire Carlson MRN: [id number] Epilepsy Attending: Lora Havens Referring Physician/Provider: Dr Kerney Elbe Duration: 09/08/2020 2337 to 09/09/2020  [id number], [id number] Patient history: 80 y.o. female presenting after acute onset of left facial droop and left tongue paresthesia at home. EEG to evaluate for seizure Level of alertness: Awake, asleep AEDs during EEG study: None Technical aspects: This EEG study was done with scalp electrodes positioned according to the 10-20 International system of electrode placement. Electrical activity was acquired at a sampling rate of 500Hz  and reviewed with a high frequency filter of 70Hz  and a low frequency filter of 1Hz . EEG data were recorded continuously and digitally stored. Description: The posterior dominant rhythm consists of 10 Hz activity of moderate voltage (25-35 uV) seen predominantly in posterior head regions, symmetric and reactive to eye opening and eye closing. Sleep was characterized by vertex waves, sleep spindles (12 to 14 Hz), maximal frontocentral region. Hyperventilation and photic stimulation were not performed.   IMPRESSION: This study is within normal limits. No seizures or epileptiform discharges were seen throughout the recording.  Lora Havens   CT HEAD CODE STROKE WO CONTRAST  Result Date: 09/08/2020 CLINICAL DATA:  Code stroke. Left-sided facial droop beginning this afternoon. EXAM: CT HEAD WITHOUT CONTRAST TECHNIQUE: Contiguous axial images were obtained from the base of the skull through the vertex without intravenous contrast. COMPARISON:  08/09/2014 FINDINGS: Brain: Mild age related volume loss. No focal abnormality seen affecting the brainstem or cerebellum. Cerebral hemispheres show minimal small vessel change of the white matter. No cortical or large vessel territory infarction. No mass lesion, hemorrhage, hydrocephalus or extra-axial collection. Vascular: There is atherosclerotic calcification of the major vessels at the base of the brain. No evidence of hyperdense distal vessel. Skull: Normal Sinuses/Orbits: Clear/normal Other: None ASPECTS ([place] Stroke Program Early CT Score) - Ganglionic level infarction (caudate, lentiform nuclei, internal capsule, insula, M1-M3 cortex): 7 - Supraganglionic infarction (M4-M6 cortex): 3 Total score (0-10 with 10 being normal): 10 IMPRESSION: 1. No acute finding by CT. Mild chronic small-vessel change of the cerebral hemispheric white matter. 2. ASPECTS is 10. 3. These results were communicated to Dr. Cheral Marker at 5:32 pmon 6/15/2022by text page via the [facility] messaging system. Electronically Signed   By: Jan Fireman.D.  On: 09/08/2020 17:34      PHYSICAL EXAM  Temp:  [98.1 F (36.7 C)-98.7 F (37.1 C)] 98.1 F (36.7 C) (06/16 0723) Pulse Rate:  [58-84] 75 (06/16 1100) Resp:  [12-20] 15 (06/16 1100) BP: (151-195)/(70-99) 179/91 (06/16 1100) SpO2:  [90 %-99 %] 95 % (06/16 1100)  General - Well nourished, well developed, in no apparent distress.  Ophthalmologic - fundi not visualized due to noncooperation.  Cardiovascular - Regular rhythm and rate.  Ear - left external ear cannel no vesicles, no redness.   Mental Status -  Level of arousal and orientation to time,  place, and person were intact. Language including expression, naming, repetition, comprehension was assessed and found intact. Attention span and concentration were normal. Recent and remote memory were intact. Fund of Knowledge was assessed and was intact.  Cranial Nerves II - XII - II - Visual field intact OU. III, IV, VI - Extraocular movements intact. V - Facial sensation intact bilaterally. VII - mild left nasolabial fold flattening, mild left facial droop, mildly decreased left eye closure and wrinkles with lifting left eyebrow. VIII - Hearing & vestibular intact bilaterally. X - Palate elevates symmetrically. XI - Chin turning & shoulder shrug intact bilaterally. XII - Tongue protrusion intact.  Motor Strength - The patient's strength was normal in all extremities and pronator drift was absent.  Bulk was normal and fasciculations were absent.   Motor Tone - Muscle tone was assessed at the neck and appendages and was normal.  Reflexes - The patient's reflexes were symmetrical in all extremities and she had no pathological reflexes.  Sensory - Light touch, temperature/pinprick were assessed and were symmetrical.    Coordination - The patient had normal movements in the hands and feet with no ataxia or dysmetria.  Tremor was absent.  Gait and Station - deferred.   ASSESSMENT/PLAN Ms. Drea Jurewicz is a 80 y.o. female with history of HTN, CKD IIIa, HLD, obesity, right breast cancer s/p Sx 2019 on anastrozole without recurrence admitted for left facial droop and left tongue tingling.   Mild left bell's palsy vs. Complicated headache Resultant probable very mild left upper and lower facial weakness MRI  neg for stroke MRA  unremarkable  Carotid Doppler unremarkable 2D Echo EF 70 to 75% LDL 124 HgbA1c pending  SCDs for VTE prophylaxis  No antithrombotic prior to admission, now on No antithrombotic. No indication for antiplatelet at this time.  On prednisone 60mg  daily and  valtrex 1g tid for 7 days course.  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  CKD IIIa, Cre 1.15  Hospital day # 0  Neurology will sign off. Please call with questions. Pt will follow up with stroke clinic NP at [facility] in about 4 weeks. Thanks for the consult.   Rosalin Hawking, MD PhD Stroke Neurology 09/09/2020 12:21 PM    To contact Stroke Continuity provider, please refer to [email or web address] After hours, contact General Neurology

## 2020-09-10 LAB — HEMOGLOBIN A1C
Hgb A1c MFr Bld: 5.7 % — ABNORMAL HIGH (ref 4.8–5.6)
Mean Plasma Glucose: 117 mg/dL

## 2020-10-17 ENCOUNTER — Other Ambulatory Visit: Payer: Self-pay | Admitting: Hematology and Oncology

## 2020-10-17 DIAGNOSIS — C50112 Malignant neoplasm of central portion of left female breast: Secondary | ICD-10-CM

## 2020-10-17 DIAGNOSIS — Z170 Estrogen receptor positive status [ER+]: Secondary | ICD-10-CM

## 2020-10-18 ENCOUNTER — Other Ambulatory Visit: Payer: Self-pay

## 2020-10-25 NOTE — Progress Notes (Signed)
[facility]  [address] [place],  [place]  [address] [phone]  Clinic Day:  11/01/2020  Referring physician: Raina Carlson., MD   This document serves as a record of services personally performed by Claire Poisson, MD. It was created on their behalf by [facility], a trained medical scribe. The creation of this record is based on the scribe's personal observations and the provider's statements to them.  CHIEF COMPLAINT:  CC: Stage IA hormone receptor positive right breast cancer   Current Treatment:  Anastrazole 1 mg daily; Annual IV Reclast for osteopenia   HISTORY OF PRESENT ILLNESS:  Claire Carlson is a 80 y.o. female with a history of stage IA (T1c N0 M0) hormone receptor positive right breast cancer diagnosed in December 2018.  She was treated with lumpectomy in January 2019.  Pathology revealed a 1.7 cm, grade 2-3, invasive ductal carcinoma , as well as high-grade ductal carcinoma in situ with areas of close margin and lymphovascular invasion.  Estrogen and progesterone receptors were positive and her 2 Neu negative.  Ki 67 was 15%.  Three lymph nodes were negative for metastasis.  EndoPredict revealed a score of 4, which is in the high risk category, and corresponds to an 18% risk of recurrence when treated with endocrine therapy alone.  We recommended adjuvant chemotherapy with docetaxel/cyclophosphamide for 4 cycles, which she started in February.  Due to excessive toxicities, she discontinued chemotherapy after her 2nd cycle.  She had a history of osteopenia and had been receiving Reclast every 2 years prior to coming in for her breast cancer treatment.  Bone density scan in February 2019 revealed osteopenia with a T-score of -2 in the femur, which was stable when compared to 2016.  We recommended yearly Reclast due to the risk of worsening bone density associated with anastrozole.  She received Reclast in April when she recovered from  chemotherapy.  She received adjuvant radiation to the right breast completed in in May.  When she was seen in June for routine follow-up, she was placed on adjuvant hormonal therapy with anastrozole 1 mg daily, which she started July 1st 2019.  Bilateral diagnostic mammogram in November 2020 did not reveal any evidence of malignancy.  We reviewed her family history and her sister has had stage I endometrial cancer at age 7, a first cousin had pancreatic cancer at age 28, her mother had lung cancer at age 36 and her maternal grandfather had liver cancer.  Her cousin Claire Carlson also had malignancy of unknown type and unknown age.  Bone density scan from March 2021 showed stable osteopenia with a T-score of -2.0 of the left femur.  Dual femur total mean measures -1.1, previously -1.0.  The AP spine is within the normal limits with a T-score of -0.5, previously -0.4.  Recent mammogram in December 2021 was negative for any findings concerning for disease.  Seroma cavity at 11 o'clock in the right breast which measures 2.3 cm.  Well healed lumpectomy scar at the areolar margin in the upper inner quadrant of the right breast.  Hyperechoic nodule measuring 5 mm on ultrasound and consistent with fat necrosis.  Diagnostic mammogram in 1 year was recommended.  INTERVAL HISTORY:  [name] is here for routine follow up and she is doing well.  She had Bell's palsy in June 2022, and she has follow up with neurologist in September.  She continues anastrozole daily without significant difficulty.  Her sister that had the endometrial cancer has  now been diagnosed with cancer of the tongue and neck which is positive for HPV.  She is being treated with radiation and weekly chemotherapy.  She then fell and fractured her hip.  Her  appetite is good, and she is eating well.  She denies fever, chills or other signs of infection.  She denies nausea, vomiting, bowel issues, or abdominal pain.  She denies sore throat, cough, dyspnea, or chest  pain.  REVIEW OF SYSTEMS:  Review of Systems  Constitutional: Negative.  Negative for appetite change, chills, fatigue, fever and unexpected weight change.  HENT:  Negative.    Eyes: Negative.   Respiratory: Negative.  Negative for chest tightness, cough, hemoptysis, shortness of breath and wheezing.   Cardiovascular: Negative.  Negative for chest pain, leg swelling and palpitations.  Gastrointestinal: Negative.  Negative for abdominal distention, abdominal pain, blood in stool, constipation, diarrhea, nausea and vomiting.  Endocrine: Negative.   Genitourinary: Negative.  Negative for difficulty urinating, dysuria, frequency and hematuria.   Musculoskeletal: Negative.  Negative for arthralgias, back pain, flank pain, gait problem and myalgias.  Skin: Negative.   Neurological: Negative.  Negative for dizziness, extremity weakness, gait problem, headaches, light-headedness, numbness, seizures and speech difficulty.  Hematological: Negative.   Psychiatric/Behavioral: Negative.  Negative for depression and sleep disturbance. The patient is not nervous/anxious.     VITALS:  Blood pressure (!) 146/65, pulse (!) 52, temperature 98.2 F (36.8 C), temperature source Oral, resp. rate 18, height 5' 5.5" (1.664 m), weight 170 lb 14.4 oz (77.5 kg), SpO2 98 %.  Wt Readings from Last 3 Encounters:  11/01/20 170 lb 14.4 oz (77.5 kg)  05/25/20 171 lb 6.4 oz (77.7 kg)  05/03/20 170 lb 3.2 oz (77.2 kg)    Body mass index is 28.01 kg/m.  Performance status (ECOG): 0 - Asymptomatic  PHYSICAL EXAM:  Physical Exam Constitutional:      General: She is not in acute distress.    Appearance: Normal appearance. She is normal weight.  HENT:     Head: Normocephalic and atraumatic.  Eyes:     General: No scleral icterus.    Extraocular Movements: Extraocular movements intact.     Conjunctiva/sclera: Conjunctivae normal.     Pupils: Pupils are equal, round, and reactive to light.  Cardiovascular:      Rate and Rhythm: Regular rhythm. Bradycardia present.     Pulses: Normal pulses.     Heart sounds: Normal heart sounds. No murmur heard.   No friction rub. No gallop.  Pulmonary:     Effort: Pulmonary effort is normal. No respiratory distress.     Breath sounds: Normal breath sounds.  Chest:  Breasts:    Right: Normal.     Left: Normal.     Comments: Breast exam is negative Abdominal:     General: Bowel sounds are normal. There is no distension.     Palpations: Abdomen is soft. There is no hepatomegaly, splenomegaly or mass.     Tenderness: There is no abdominal tenderness.  Musculoskeletal:        General: Normal range of motion.     Cervical back: Normal range of motion and neck supple.     Right lower leg: No edema.     Left lower leg: No edema.  Lymphadenopathy:     Cervical: No cervical adenopathy.  Skin:    General: Skin is warm and dry.  Neurological:     General: No focal deficit present.     Mental Status: She  is alert and oriented to person, place, and time. Mental status is at baseline.  Psychiatric:        Mood and Affect: Mood normal.        Behavior: Behavior normal.        Thought Content: Thought content normal.        Judgment: Judgment normal.    LABS:   CBC Latest Ref Rng & Units 11/01/2020 09/09/2020 09/08/2020  WBC - 6.0 7.9 -  Hemoglobin 12.0 - 16.0 13.9 13.6 14.3  Hematocrit 36 - 46 41 42.4 42.0  Platelets 150 - 399 273 272 -   CMP Latest Ref Rng & Units 11/01/2020 09/09/2020 09/08/2020  Glucose 70 - 99 mg/dL - 131(H) 104(H)  BUN 4 - 21 27(A) 11 14  Creatinine 0.5 - 1.1 1.2(A) 1.15(H) 1.10(H)  Sodium 137 - 147 136(A) 135 136  Potassium 3.4 - 5.3 5.0 4.0 4.0  Chloride 99 - 108 95(A) 98 97(L)  CO2 13 - 22 31(A) 28 -  Calcium 8.7 - 10.7 10.3 9.5 -  Total Protein 6.5 - 8.1 g/dL - 6.8 -  Total Bilirubin 0.3 - 1.2 mg/dL - 0.7 -  Alkaline Phos 25 - 125 53 45 -  AST 13 - 35 31 32 -  ALT 7 - 35 22 25 -    STUDIES:  No results found.   Allergies:   Allergies  Allergen Reactions   Penicillin G Other (See Comments)    Welts   Rosuvastatin Other (See Comments)    Cramping of the legs   Statins Other (See Comments)    Cramping of the legs   Tape     Some tapes break out the skin- Paper tape is tolerated   Biaxin [Clarithromycin] Rash    Current Medications: Current Outpatient Medications  Medication Sig Dispense Refill   ALPRAZolam (XANAX) 0.25 MG tablet Take one-half to one by mouth 8 hours prn rescue/anxiety.  Refills after prescription expiration require follow-up evaluation at the office.     anastrozole (ARIMIDEX) 1 MG tablet TAKE 1 TABLET(1 MG) BY MOUTH DAILY 90 tablet 3   atenolol (TENORMIN) 25 MG tablet Take 12.5 mg by mouth in the morning.     Calcium-Phosphorus-Vitamin D (CITRACAL +D3 PO) Take 1 tablet by mouth daily.     Cholecalciferol (VITAMIN D3) 50 MCG (2000 UT) TABS Take 2,000 Units by mouth in the morning.     CINNAMON PO Take 200 mg by mouth daily.     hydrochlorothiazide (HYDRODIURIL) 25 MG tablet Take 25 mg by mouth daily.     LECITHIN PO Take 1 tablet by mouth daily.     levothyroxine (SYNTHROID, LEVOTHROID) 88 MCG tablet Take 88 mcg by mouth daily.     loratadine (CLARITIN) 10 MG tablet Take 10 mg by mouth daily as needed for allergies or rhinitis.     Magnesium 250 MG TABS Take 250 mg by mouth daily with supper.     Meclizine HCl 25 MG CHEW Chew 25 mg by mouth daily as needed (dizziness).     Multiple Vitamins-Calcium (ONE-A-DAY WOMENS FORMULA PO) Take 1 tablet by mouth daily with breakfast.     omeprazole (PRILOSEC) 20 MG capsule Take 20 mg by mouth daily before breakfast.     vitamin C (ASCORBIC ACID) 500 MG tablet Take 500 mg by mouth daily.     vitamin E 200 UNIT capsule Take 200 Units by mouth daily.      No current facility-administered medications for this  visit.     ASSESSMENT & PLAN:   Assessment:   1. Stage IA hormone receptor positive breast cancer, diagnosed in December 2018, with no  evidence of disease.  She was treated with surgery, partial chemotherapy, adjuvant radiation and now hormonal therapy.  She was placed on anastrozole 1 mg daily in July 2019, and will continue this for 5 years.    2. Osteopenia, she receives Reclast every other year and will be due in 2023.  She will be due for repeat bone density in March 2023.  3. Barrett's esophagus, followed by Dr. Lyda Jester.  4.  Borderline hypercalcemia.  I don't think this is a concern, and we will monitor.    5.  Bell's palsy in June 2022.    6.  Mild chronic kidney disease.  Her creatinine is up slightly at 1.2 with a BUN of 27, so I encouraged her to increase her fluid intake.  Plan: She had yearly Reclast in June 2021 and will be due again in June 2023.  We will see her back in 7 months with CBC, CMP, and bone density for repeat evaluation.  She verbalizes understanding of and agreement to the plans discussed today. She knows to call the office should any new questions or concerns arise.    Derwood Kaplan, MD [facility] [address] [place] [address] Dept: [phone] Dept Fax: [phone]   I, Rita Ohara, am acting as scribe for Derwood Kaplan, MD  I have reviewed this report as typed by the medical scribe, and it is complete and accurate.

## 2020-10-27 ENCOUNTER — Inpatient Hospital Stay: Payer: Medicare Other | Admitting: Adult Health

## 2020-10-29 ENCOUNTER — Other Ambulatory Visit: Payer: Self-pay | Admitting: Oncology

## 2020-10-29 DIAGNOSIS — Z171 Estrogen receptor negative status [ER-]: Secondary | ICD-10-CM

## 2020-10-29 DIAGNOSIS — C50112 Malignant neoplasm of central portion of left female breast: Secondary | ICD-10-CM

## 2020-11-01 ENCOUNTER — Encounter: Payer: Self-pay | Admitting: Oncology

## 2020-11-01 ENCOUNTER — Inpatient Hospital Stay: Payer: Medicare Other

## 2020-11-01 ENCOUNTER — Other Ambulatory Visit: Payer: Self-pay

## 2020-11-01 ENCOUNTER — Other Ambulatory Visit: Payer: Self-pay | Admitting: Oncology

## 2020-11-01 ENCOUNTER — Inpatient Hospital Stay: Payer: Medicare Other | Attending: Oncology | Admitting: Oncology

## 2020-11-01 ENCOUNTER — Other Ambulatory Visit: Payer: Self-pay | Admitting: Hematology and Oncology

## 2020-11-01 VITALS — BP 146/65 | HR 52 | Temp 98.2°F | Resp 18 | Ht 65.5 in | Wt 170.9 lb

## 2020-11-01 DIAGNOSIS — M8589 Other specified disorders of bone density and structure, multiple sites: Secondary | ICD-10-CM

## 2020-11-01 DIAGNOSIS — N6489 Other specified disorders of breast: Secondary | ICD-10-CM

## 2020-11-01 DIAGNOSIS — C50112 Malignant neoplasm of central portion of left female breast: Secondary | ICD-10-CM | POA: Diagnosis not present

## 2020-11-01 DIAGNOSIS — Z171 Estrogen receptor negative status [ER-]: Secondary | ICD-10-CM

## 2020-11-01 LAB — HEPATIC FUNCTION PANEL
ALT: 22 (ref 7–35)
AST: 31 (ref 13–35)
Alkaline Phosphatase: 53 (ref 25–125)
Bilirubin, Total: 0.5

## 2020-11-01 LAB — BASIC METABOLIC PANEL
BUN: 27 — AB (ref 4–21)
CO2: 31 — AB (ref 13–22)
Chloride: 95 — AB (ref 99–108)
Creatinine: 1.2 — AB (ref 0.5–1.1)
Glucose: 100
Potassium: 5 (ref 3.4–5.3)
Sodium: 136 — AB (ref 137–147)

## 2020-11-01 LAB — CBC AND DIFFERENTIAL
HCT: 41 (ref 36–46)
Hemoglobin: 13.9 (ref 12.0–16.0)
Neutrophils Absolute: 3.42
Platelets: 273 (ref 150–399)
WBC: 6

## 2020-11-01 LAB — CBC: RBC: 4.8 (ref 3.87–5.11)

## 2020-11-01 LAB — COMPREHENSIVE METABOLIC PANEL
Albumin: 4.6 (ref 3.5–5.0)
Calcium: 10.3 (ref 8.7–10.7)

## 2020-11-12 ENCOUNTER — Encounter: Payer: Self-pay | Admitting: Oncology

## 2020-11-19 ENCOUNTER — Other Ambulatory Visit: Payer: Self-pay

## 2020-11-23 ENCOUNTER — Ambulatory Visit (INDEPENDENT_AMBULATORY_CARE_PROVIDER_SITE_OTHER): Payer: Medicare Other | Admitting: Cardiology

## 2020-11-23 ENCOUNTER — Other Ambulatory Visit: Payer: Self-pay

## 2020-11-23 ENCOUNTER — Encounter: Payer: Self-pay | Admitting: Cardiology

## 2020-11-23 VITALS — BP 162/76 | HR 68 | Ht 64.6 in | Wt 169.4 lb

## 2020-11-23 DIAGNOSIS — I4729 Other ventricular tachycardia: Secondary | ICD-10-CM

## 2020-11-23 DIAGNOSIS — I472 Ventricular tachycardia: Secondary | ICD-10-CM

## 2020-11-23 DIAGNOSIS — E782 Mixed hyperlipidemia: Secondary | ICD-10-CM

## 2020-11-23 DIAGNOSIS — I700 Atherosclerosis of aorta: Secondary | ICD-10-CM | POA: Diagnosis not present

## 2020-11-23 DIAGNOSIS — I10 Essential (primary) hypertension: Secondary | ICD-10-CM

## 2020-11-23 NOTE — Progress Notes (Signed)
Cardiology Office Note:    Date:  11/23/2020   ID:  Claire Carlson, DOB September 01, 1940, MRN [id number]  PCP:  Raina Mina., MD  Cardiologist:  Jenean Lindau, MD   Referring MD: Raina Mina., MD    ASSESSMENT:    1. Aortic atherosclerosis ([place])   2. Essential hypertension   3. Mixed hyperlipidemia   4. Nonsustained ventricular tachycardia (HCC)    PLAN:    In order of problems listed above:  Aortic atherosclerosis: Secondary prevention stressed with the patient.  Importance of compliance with diet medication stressed and she vocalized understanding.  She was advised to walk on a regular basis. Essential hypertension: Blood pressure stable.  She has an element of whitecoat hypertension.  She keeps a track of her blood pressures at home.  Lifestyle modification urged. Mixed dyslipidemia: Diet was emphasized.  She is doing weight watchers now.  She is making a trip to [place] and she wants to come back and then she will have complete blood work in a month Chem-7 liver lipid check. Her Chem-7 was abnormal the last 1 I have in the chart so we will repeat this today. Patient will be seen in follow-up appointment in 6 months or earlier if the patient has any concerns    Medication Adjustments/Labs and Tests Ordered: Current medicines are reviewed at length with the patient today.  Concerns regarding medicines are outlined above.  No orders of the defined types were placed in this encounter.  No orders of the defined types were placed in this encounter.    No chief complaint on file.    History of Present Illness:    Claire Carlson is a 80 y.o. female.  Patient has past medical history of aortic atherosclerosis, essential hypertension and dyslipidemia.  She denies any problems at this time and takes care of activities of daily living.  No chest pain orthopnea or PND.  At the time of my evaluation, the patient is alert awake oriented and in no distress.  Past Medical  History:  Diagnosis Date   Anxiety    Anxiety disorder 07/14/2015   Aortic atherosclerosis ([place]) 11/03/2019   Formatting of this note might be different from the original. seen on x-rays at [facility] 09/2019   Barrett's esophagus    Breast mass 02/07/2017   Cancer ([place]) 02/2017   right breast cancer   Complication of anesthesia    Degenerative lumbar disc 11/03/2019   Formatting of this note might be different from the original. 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   Loss of hearing 06/15/2015   Last Assessment & Plan:  Relevant Hx: Course: Daily Update: Today's Plan:with some removal of dry wax and skin and recommendation to use some sweet oil for her ears and softening them up more, she was as well given some flonase to use for her eustachian tube   Electronically signed by: Mayer Camel, NP 06/15/15 2201  Last Assessment & Plan:  Formatting of this note might be different   Malignant neoplasm of central portion of left breast in female, estrogen receptor negative ([place]) 02/07/2017   Mixed hyperlipidemia 06/15/2015   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 Uses reclast.   Pain of left foot 07/31/2018   Pedal edema 10/15/2017   Polyarthralgia 02/05/2020   PONV (postoperative nausea and vomiting)    Post-menopausal 05/03/2017   Pre-ulcerative calluses 07/31/2018   Prediabetes 06/15/2015   PVC (premature ventricular contraction)    Stage 3a chronic kidney disease ([place])  06/15/2015   Vertigo    Vitamin D deficiency 06/15/2015    Past Surgical History:  Procedure Laterality Date   BREAST LUMPECTOMY WITH RADIOACTIVE SEED AND SENTINEL LYMPH NODE BIOPSY Right 04/06/2017   Procedure: RIGHT BREAST LUMPECTOMY WITH RADIOACTIVE SEED X 2 AND SENTINEL LYMPH NODE BIOPSY;  Surgeon: Excell Seltzer, MD;  Location: [place];  Service: General;  Laterality: Right;   CESAREAN SECTION     CHOLECYSTECTOMY     EYE SURGERY     cataracts   MOUTH SURGERY     TONSILLECTOMY      Current Medications: Current Meds  Medication Sig   ALPRAZolam (XANAX) 0.25 MG tablet Take 0.5-1 tablets by mouth every 8 (eight) hours as needed for anxiety.   anastrozole (ARIMIDEX) 1 MG tablet TAKE 1 TABLET(1 MG) BY MOUTH DAILY   atenolol (TENORMIN) 25 MG tablet Take 12.5 mg by mouth in the morning.   Calcium-Phosphorus-Vitamin D (CITRACAL +D3 PO) Take 1 tablet by mouth daily.   Cholecalciferol (VITAMIN D3) 50 MCG (2000 UT) TABS Take 2,000 Units by mouth in the morning.   CINNAMON PO Take 200 mg by mouth daily.   hydrochlorothiazide (HYDRODIURIL) 25 MG tablet Take 25 mg by mouth daily.   LECITHIN PO Take 1 tablet by mouth daily.   levothyroxine (SYNTHROID, LEVOTHROID) 88 MCG tablet Take 88 mcg by mouth daily.   loratadine (CLARITIN) 10 MG tablet Take 10 mg by mouth daily as needed for allergies or rhinitis.   Magnesium 250 MG TABS Take 250 mg by mouth daily with supper.   Meclizine HCl 25 MG CHEW Chew 25 mg by mouth as needed for dizziness.   Multiple Vitamins-Calcium (ONE-A-DAY WOMENS FORMULA PO) Take 1 tablet by mouth daily with breakfast.   omeprazole (PRILOSEC) 20 MG capsule Take 20 mg by mouth daily before breakfast.   vitamin C (ASCORBIC ACID) 500 MG tablet Take 500 mg by mouth daily.   vitamin E 200 UNIT capsule Take 200 Units by mouth daily.      Allergies:   Penicillin g, Rosuvastatin, Statins, Tape, and Biaxin [clarithromycin]   Social History   Socioeconomic  History   Marital status: Married    Spouse name: Not on file   Number of children: Not on file   Years of education: Not on file   Highest education level: Not on file  Occupational History   Not on file  Tobacco Use   Smoking status: Former    Types: Cigarettes    Quit date: 46    Years since quitting: 37.6   Smokeless tobacco: Never  Vaping Use   Vaping Use: Never used  Substance and Sexual Activity   Alcohol use: No   Drug use: No   Sexual activity: Not on file  Other Topics Concern   Not on file  Social History Narrative   Not on file   Social Determinants of Health   Financial Resource Strain: Not on file  Food Insecurity: Not on file  Transportation Needs: Not on file  Physical Activity: Not on file  Stress: Not on file  Social Connections: Not on file     Family History: The patient's family history includes Heart attack in her father.  ROS:   Please see the history of present illness.    All other systems reviewed and are negative.  EKGs/Labs/Other Studies Reviewed:    The following studies were reviewed today: IMPRESSIONS     1. Left ventricular ejection fraction, by estimation, is 70 to 75%. The  left ventricle has hyperdynamic function. The left ventricle has no  regional wall motion abnormalities. There is mild concentric left  ventricular hypertrophy. Left ventricular  diastolic parameters are consistent with Grade I diastolic dysfunction  (impaired relaxation). Elevated left ventricular end-diastolic pressure.   2. Right ventricular systolic function is normal. The right ventricular  size is normal. Tricuspid regurgitation signal is inadequate for assessing  PA pressure.   3. The mitral valve is normal in structure. No evidence of mitral valve  regurgitation. No evidence of mitral stenosis.   4. The aortic valve is normal in structure. Aortic valve regurgitation is  not visualized. No aortic stenosis is present.   5. The inferior vena cava  is normal in size with greater than 50%  respiratory variability, suggesting right atrial pressure of 3 mmHg.    Recent Labs: 11/01/2020: ALT 22; BUN 27; Creatinine 1.2; Hemoglobin 13.9; Platelets 273; Potassium 5.0; Sodium 136  Recent Lipid Panel    Component Value Date/Time   CHOL 213 (H) 09/09/2020 0203   CHOL 181 07/31/2018 0928   TRIG 188 (H) 09/09/2020 0203   HDL 51 09/09/2020 0203   HDL 55 07/31/2018 0928   CHOLHDL 4.2 09/09/2020 0203   VLDL 38 09/09/2020 0203   LDLCALC 124 (H) 09/09/2020 0203   LDLCALC 95 07/31/2018 0928    Physical Exam:    VS:  BP (!) 162/76   Pulse 68   Ht 5' 4.6" (1.641 m)   Wt 169 lb 6.4 oz (76.8 kg)   SpO2 96%   BMI 28.54 kg/m     Wt Readings from Last 3 Encounters:  11/23/20 169 lb 6.4 oz (76.8 kg)  11/01/20 170 lb 14.4 oz (77.5 kg)  05/25/20 171 lb 6.4 oz (77.7 kg)     GEN: Patient is in no acute distress HEENT: Normal NECK: No JVD; No carotid bruits LYMPHATICS: No lymphadenopathy CARDIAC: Hear sounds regular, 2/6 systolic murmur at the apex. RESPIRATORY:  Clear to auscultation without rales, wheezing or rhonchi  ABDOMEN: Soft, non-tender, non-distended MUSCULOSKELETAL:  No edema; No deformity  SKIN: Warm and dry NEUROLOGIC:  Alert and oriented x 3 PSYCHIATRIC:  Normal affect   Signed, Jenean Lindau, MD  11/23/2020 1:19 PM    [facility]

## 2020-11-23 NOTE — Patient Instructions (Signed)
Medication Instructions:  No medication changes. *If you need a refill on your cardiac medications before your next appointment, please call your pharmacy*   Lab Work: Your physician recommends that you have a BMET today in the office.  Your physician recommends that you return for lab work in: 1 month You need to have labs done when you are fasting.  You can come Monday through Friday 8:30 am to 12:00 pm and 1:15 to 4:30. You do not need to make an appointment as the order has already been placed. The labs you are going to have done are BMET, LFT and Lipids.   If you have labs (blood work) drawn today and your tests are completely normal, you will receive your results only by: [place] (if you have MyChart) OR A paper copy in the mail If you have any lab test that is abnormal or we need to change your treatment, we will call you to review the results.   Testing/Procedures: None ordered   Follow-Up: At [facility], you and your health needs are our priority.  As part of our continuing mission to provide you with exceptional heart care, we have created designated Provider Care Teams.  These Care Teams include your primary Cardiologist (physician) and Advanced Practice Providers (APPs -  Physician Assistants and Nurse Practitioners) who all work together to provide you with the care you need, when you need it.  We recommend signing up for the patient portal called "MyChart".  Sign up information is provided on this After Visit Summary.  MyChart is used to connect with patients for Virtual Visits (Telemedicine).  Patients are able to view lab/test results, encounter notes, upcoming appointments, etc.  Non-urgent messages can be sent to your provider as well.   To learn more about what you can do with MyChart, go to [email or web address].    Your next appointment:   6 month(s)  The format for your next appointment:   In Person  Provider:   Jyl Heinz, MD   Other  Instructions NA

## 2020-11-24 LAB — BASIC METABOLIC PANEL
BUN/Creatinine Ratio: 21 (ref 12–28)
BUN: 24 mg/dL (ref 8–27)
CO2: 25 mmol/L (ref 20–29)
Calcium: 10.3 mg/dL (ref 8.7–10.3)
Chloride: 96 mmol/L (ref 96–106)
Creatinine, Ser: 1.17 mg/dL — ABNORMAL HIGH (ref 0.57–1.00)
Glucose: 95 mg/dL (ref 65–99)
Potassium: 4.1 mmol/L (ref 3.5–5.2)
Sodium: 138 mmol/L (ref 134–144)
eGFR: 47 mL/min/{1.73_m2} — ABNORMAL LOW (ref >59)

## 2020-11-25 ENCOUNTER — Ambulatory Visit (INDEPENDENT_AMBULATORY_CARE_PROVIDER_SITE_OTHER): Payer: Medicare Other | Admitting: Adult Health

## 2020-11-25 ENCOUNTER — Encounter: Payer: Self-pay | Admitting: Adult Health

## 2020-11-25 VITALS — BP 124/73 | HR 59 | Ht 64.0 in | Wt 169.0 lb

## 2020-11-25 DIAGNOSIS — G510 Bell's palsy: Secondary | ICD-10-CM | POA: Diagnosis not present

## 2020-11-25 DIAGNOSIS — M5481 Occipital neuralgia: Secondary | ICD-10-CM

## 2020-11-25 NOTE — Patient Instructions (Addendum)
Thank you for coming to see [place] at [facility]. I hope we have been able to provide you high quality care today.  You may receive a patient satisfaction survey over the next few weeks. We would appreciate your feedback and comments so that we may continue to improve ourselves and the health of our patients.     Bell's Palsy, Adult Bell's palsy is a short-term inability to move muscles in a part of the face. The inability to move, also called paralysis, results from inflammation or compression of the seventh cranial nerve. This nerve travels along the skull and under the ear to the side of the face. This nerve is responsible for facial movements that include blinking, closing the eyes, smiling, and frowning. What are the causes? The exact cause of this condition is not known. It may be caused by an infection from a virus, such as the chickenpox (herpes zoster), Epstein-Barr, or mumps virus. What increases the risk? You are more likely to develop this condition if: You are pregnant. You have diabetes. You have had a recent infection in your nose, throat, or airways. You have a weakened body defense system (immune system). You have had a facial injury, such as a fracture. You have a family history of Bell's palsy. What are the signs or symptoms? Symptoms of this condition include: Weakness on one side of the face. Drooping eyelid and corner of the mouth. Excessive tearing in one eye. Difficulty closing the eyelid. Dry eye. Drooling. Dry mouth. Changes in taste. Change in facial appearance. Pain behind one ear. Ringing in one or both ears. Sensitivity to sound in one ear. Facial twitching. Headache. Impaired speech. Dizziness. Difficulty eating or drinking. Most of the time, only one side of the face is affected. In rare cases, Bell's palsy may affect the whole face. How is this diagnosed? This condition is diagnosed based on: Your symptoms. Your medical  history. A physical exam. You may also have to see health care providers who specialize in disorders of the nerves (neurologist) or diseases and conditions of the eye (ophthalmologist). You may have tests, such as: A test to check for nerve damage (electromyogram). Imaging studies, such as a CT scan or an MRI. Blood tests. How is this treated? This condition affects every person differently. Sometimes symptoms go away without treatment within a couple weeks. If treatment is needed, it varies from person to person. The goal of treatment is to reduce inflammation and protect the eye from damage. Treatment for Bell's palsy may include: Medicines, such as: Steroids to reduce swelling and inflammation. Antiviral medicines. Pain relievers, including aspirin, acetaminophen, or ibuprofen. Eye drops or ointment to keep your eye moist. Eye protection, if you cannot close your eye. Exercises or massage to regain muscle strength and function (physical therapy). Follow these instructions at home:  Take over-the-counter and prescription medicines only as told by your health care provider. If your eye is affected: Keep your eye moist with eye drops or ointment as told by your health care provider. Follow instructions for eye care and protection as told by your health care provider. Do any physical therapy exercises as told by your health care provider. Keep all follow-up visits. This is important. Contact a health care provider if: You have a fever or chills. Your symptoms do not get better within 2-3 weeks, or your symptoms get worse. Your eye is red, irritated, or painful. You have new symptoms. Get help right away if: You have weakness or numbness  in a part of your body other than your face. You have trouble swallowing. You develop neck pain or stiffness. You develop dizziness or shortness of breath. Summary Bell's palsy is a short-term inability to move muscles in a part of the face. The  inability to move results from inflammation or compression of the facial nerve. This condition affects every person differently. Sometimes symptoms go away without treatment within a couple weeks. If treatment is needed, it varies from person to person. The goal of treatment is to reduce inflammation and protect the eye from damage. Contact your health care provider if your symptoms do not get better within 2-3 weeks, or your symptoms get worse. This information is not intended to replace advice given to you by your health care provider. Make sure you discuss any questions you have with your health care provider. Document Revised: 12/11/2019 Document Reviewed: 12/11/2019 Elsevier Patient Education  2022 [employer].      Occipital Neuralgia Occipital neuralgia is a type of headache that causes brief episodes of very bad pain in the back of the head. Pain from occipital neuralgia may spread (radiate) to other parts of the head. These headaches may be caused by irritation of the nerves that leave the spinal cord high up in the neck, just below the base of the skull (occipital nerves). The occipital nerves transmit sensations from the back of the head, the top of the head, and the areas behind the ears. What are the causes? This condition can occur without any known cause (primary headache syndrome). In other cases, this condition is caused by pressure on or irritation of one of the two occipital nerves. Pressure and irritation may be due to: Muscle spasm in the neck. Neck injury. Wear and tear of the vertebrae in the neck (osteoarthritis). Disease of the disks that separate the vertebrae. Swollen blood vessels that put pressure on the occipital nerves. Infections. Tumors. Diabetes. What are the signs or symptoms? This condition causes brief burning, stabbing, electric, shocking, or shooting pain in the back of the head that can radiate to the top of the head. It can happen on one side or  both sides of the head. It can also cause: Pain behind the eye. Pain triggered by neck movement or hair brushing. Scalp tenderness. Aching in the back of the head between episodes of very bad pain. Pain that gets worse with exposure to bright lights. How is this diagnosed? Your health care provider may diagnose the condition based on a physical exam and your symptoms. Tests may be done, such as: Imaging studies of the brain and neck (cervical spine), such as an MRI or CT scan. These look for causes of pinched nerves. Applying pressure to the nerves in the neck to try to re-create the pain. Injection of numbing medicine into the occipital nerve areas to see if pain goes away (diagnostic nerve block). How is this treated? Treatment for this condition may begin with simple measures, such as: Rest. Massage. Applying heat or cold to the area. Over-the-counter pain relievers. If these measures do not work, you may need other treatments, including: Medicines, such as: Prescription-strength anti-inflammatory medicines. Muscle relaxants. Anti-seizure medicines, which can relieve pain. Antidepressants, which can relieve pain. Injected medicines, such as medicines that numb the area (local anesthetic) and steroids. Pulsed radiofrequency ablation. This is when wires are implanted to deliver electrical impulses that block pain signals from the occipital nerve. Surgery to relieve nerve pressure. Physical therapy. Follow these instructions at home:  Managing pain   Avoid any activities that cause pain. Rest when you have an attack of pain. Try gentle massage to relieve pain. Try a different pillow or sleeping position. If directed, apply heat to the affected area as often as told by your health care provider. Use the heat source that your health care provider recommends, such as a moist heat pack or a heating pad. Place a towel between your skin and the heat source. Leave the heat on for 20-30  minutes. Remove the heat if your skin turns bright red. This is especially important if you are unable to feel pain, heat, or cold. You have a greater risk of getting burned. If directed, put ice on the back of your head and neck area. To do this: Put ice in a plastic bag. Place a towel between your skin and the bag. Leave the ice on for 20 minutes, 2-3 times a day. Remove the ice if your skin turns bright red. This is very important. If you cannot feel pain, heat, or cold, you have a greater risk of damage to the area. General instructions Take over-the-counter and prescription medicines only as told by your health care provider. Avoid things that make your symptoms worse, such as bright lights. Try to stay active. Get regular exercise that does not cause pain. Ask your health care provider to suggest safe exercises for you. Work with a physical therapist to learn stretching exercises you can do at home. Practice good posture. Keep all follow-up visits. This is important. Contact a health care provider if: Your medicine is not working. You have new or worsening symptoms. Get help right away if: You have very bad head pain that does not go away. You have a sudden change in vision, balance, or speech. These symptoms may represent a serious problem that is an emergency. Do not wait to see if the symptoms will go away. Get medical help right away. Call your local emergency services (911 in the [place] Do not drive yourself to the hospital. Summary Occipital neuralgia is a type of headache that causes brief episodes of very bad pain in the back of the head. Pain from occipital neuralgia may spread (radiate) to other parts of the head. Treatment for this condition includes rest, massage, and medicines. This information is not intended to replace advice given to you by your health care provider. Make sure you discuss any questions you have with your health care provider. Document Revised:  01/11/2020 Document Reviewed: 01/11/2020 Elsevier Patient Education  2022 [employer].

## 2020-11-25 NOTE — Progress Notes (Signed)
[facility] [address] [place]. [place] [address] [phone]       HOSPITAL FOLLOW UP NOTE  [name] Date of Birth:  [birth year] Medical Record Number:  [id number]   Reason for Referral:  hospital follow up    SUBJECTIVE:   CHIEF COMPLAINT:  Chief Complaint  Patient presents with   Follow-up    Rm with daughter Claire Carlson Pt is well and stable, no complaints     HPI:   [name] is a 80 y.o. female with history of HTN, CKD IIIa, HLD, obesity, right breast cancer s/p Sx 2019 on anastrozole without recurrence admitted on 09/08/2020 for left facial droop and left tongue tingling.  Personally reviewed hospitalization pertinent progress notes, lab and imaging.  Evaluated by Dr. Erlinda Hong for mild left Bell's palsy vs complicated headache.  MRI, MRA and CUS unremarkable.  EF 70 to 75%.  LDL 124.  A1c 5.7.  No indication for antiplatelet.  Started on prednisone 60 mg daily and Valtrex for 7-day course.  Placed on Zetia.  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  No therapy patient discharged home.  Today, 11/25/2020, [name] is being seen for hospital follow-up accompanied by her daughter, Claire Carlson.  Overall doing well.  Completely recovered without residual facial weakness.  She does report a history of right-sided occipital head pains that radiate towards her right ear (started in 70s) which she experienced on 8/17. Seen by PCP who rx'd steroids but symptoms resolved prior to taking.  Describes pain that radiates from right occipital and follows occipital nerve root with quick sharp shooting pain. Only occurs on occasion and usually resolves on its own.  No further concerns at this time.     ROS:   14 system review of systems performed and negative with exception of no complaints  PMH:  Past Medical History:  Diagnosis Date   Anxiety    Anxiety disorder 07/14/2015   Aortic atherosclerosis ([place]) 11/03/2019   Formatting of this note might be  different from the original. seen on x-rays at [facility] 09/2019   Barrett's esophagus    Breast mass 02/07/2017   Cancer ([place]) 02/2017   right breast cancer   Complication of anesthesia    Degenerative lumbar disc 11/03/2019   Formatting of this note might be different from the original. 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   Loss of hearing 06/15/2015   Last Assessment & Plan:  Relevant Hx: Course: Daily Update: Today's Plan:with some removal of dry wax and skin and recommendation to use some sweet oil for her ears and softening them up more, she was as well given some flonase to use for her eustachian tube   Electronically signed by: Mayer Camel, NP 06/15/15 2201  Last Assessment & Plan:  Formatting of this note might be different   Malignant neoplasm of central portion of left breast in female, estrogen receptor negative ([place]) 02/07/2017   Mixed hyperlipidemia 06/15/2015   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 Uses reclast.   Pain of left foot 07/31/2018   Pedal edema 10/15/2017   Polyarthralgia 02/05/2020   PONV (postoperative nausea and vomiting)    Post-menopausal 05/03/2017   Pre-ulcerative calluses 07/31/2018   Prediabetes 06/15/2015   PVC (premature ventricular contraction)    Stage 3a chronic kidney disease ([place]) 06/15/2015   Vertigo    Vitamin D deficiency 06/15/2015    PSH:  Past Surgical History:  Procedure Laterality Date   BREAST LUMPECTOMY WITH  RADIOACTIVE SEED AND SENTINEL LYMPH NODE BIOPSY Right 04/06/2017   Procedure: RIGHT BREAST LUMPECTOMY WITH RADIOACTIVE SEED X 2 AND SENTINEL LYMPH NODE BIOPSY;  Surgeon: Excell Seltzer, MD;  Location: [place];  Service: General;  Laterality: Right;   CESAREAN SECTION     CHOLECYSTECTOMY     EYE SURGERY     cataracts   MOUTH SURGERY     TONSILLECTOMY      Social History:  Social History   Socioeconomic History   Marital status: Married    Spouse name: Not on file   Number of children: Not on file   Years of education: Not on file   Highest education level: Not on file  Occupational History   Not on file  Tobacco Use   Smoking status: Former    Types: Cigarettes    Quit date: 1985    Years since quitting: 37.6   Smokeless tobacco: Never  Vaping Use   Vaping Use: Never used  Substance and Sexual Activity   Alcohol use: No   Drug use: No   Sexual activity: Not on file  Other Topics Concern   Not on file  Social History Narrative   Not on file   Social Determinants of Health   Financial Resource Strain: Not on file  Food Insecurity: Not on file  Transportation Needs: Not on file  Physical Activity: Not on file  Stress: Not on file  Social Connections: Not on file  Intimate Partner Violence: Not on file    Family History:  Family History  Problem Relation Age of Onset   Heart attack Father     Medications:   Current Outpatient Medications on File Prior to Visit  Medication Sig Dispense Refill   ALPRAZolam (XANAX) 0.25 MG tablet Take 0.5-1 tablets by mouth every 8 (eight) hours as needed for anxiety.     anastrozole (ARIMIDEX) 1 MG tablet TAKE 1 TABLET(1 MG) BY MOUTH DAILY 90 tablet 3   atenolol (TENORMIN) 25 MG tablet Take 12.5 mg by mouth in the morning.     Calcium-Phosphorus-Vitamin D (CITRACAL +D3 PO) Take 1 tablet by mouth daily.     Cholecalciferol (VITAMIN D3) 50 MCG (2000 UT) TABS Take 2,000 Units by mouth in the morning.      CINNAMON PO Take 200 mg by mouth daily.     hydrochlorothiazide (HYDRODIURIL) 25 MG tablet Take 25 mg by mouth daily.     LECITHIN PO Take 1 tablet by mouth daily.     levothyroxine (SYNTHROID, LEVOTHROID) 88 MCG tablet Take 88 mcg by mouth daily.     loratadine (CLARITIN) 10 MG tablet Take 10 mg by mouth daily as needed for allergies or rhinitis.     Magnesium 250 MG TABS Take 250 mg by mouth daily with supper.     Meclizine HCl 25 MG CHEW Chew 25 mg by mouth as needed for dizziness.     Multiple Vitamins-Calcium (ONE-A-DAY WOMENS FORMULA PO) Take 1 tablet by mouth  daily with breakfast.     omeprazole (PRILOSEC) 20 MG capsule Take 20 mg by mouth daily before breakfast.     vitamin C (ASCORBIC ACID) 500 MG tablet Take 500 mg by mouth daily.     vitamin E 200 UNIT capsule Take 200 Units by mouth daily.      [DISCONTINUED] Calcium Carbonate-Vitamin D 600-400 MG-UNIT tablet Take 1 tablet by mouth daily. (Patient not taking: Reported on 09/08/2020)     No current facility-administered medications on file prior to visit.    Allergies:   Allergies  Allergen Reactions   Penicillin G Other (See Comments)    Welts   Rosuvastatin Other (See Comments)    Cramping of the legs   Statins Other (See Comments)    Cramping of the legs   Tape     Some tapes break out the skin- Paper tape is tolerated   Biaxin [Clarithromycin] Rash      OBJECTIVE:  Physical Exam  Vitals:   11/25/20 1410  BP: 124/73  Pulse: (!) 59  Weight: 169 lb (76.7 kg)  Height: '5\' 4"'$  (1.626 m)   Body mass index is 29.01 kg/m. No results found.  General: well developed, well nourished, very pleasant elderly Caucasian female, seated, in no evident distress Head: head normocephalic and atraumatic.   Neck: supple with no carotid or supraclavicular bruits Cardiovascular: regular rate and rhythm, no murmurs Musculoskeletal: no deformity Skin:  no rash/petichiae Vascular:  Normal pulses all extremities   Neurologic  Exam Mental Status: Awake and fully alert.  Fluent speech and language.  Oriented to place and time. Recent and remote memory intact. Attention span, concentration and fund of knowledge appropriate. Mood and affect appropriate.  Cranial Nerves: Fundoscopic exam reveals sharp disc margins. Pupils equal, briskly reactive to light. Extraocular movements full without nystagmus. Visual fields full to confrontation. Hearing intact. Facial sensation intact. Face, tongue, palate moves normally and symmetrically.  Motor: Normal bulk and tone. Normal strength in all tested extremity muscles Sensory.: intact to touch , pinprick , position and vibratory sensation.  Coordination: Rapid alternating movements normal in all extremities. Finger-to-nose and heel-to-shin performed accurately bilaterally. Gait and Station: Arises from chair without difficulty. Stance is normal. Gait demonstrates normal stride length and balance without use of assistive device. Reflexes: 1+ and symmetric. Toes downgoing.         ASSESSMENT: Claire Carlson is a 80 y.o. year old female with mild left Bell's palsy vs complicated headache on [id number] after presenting with left facial weakness/numbness and left ear pain. Vascular risk factors include HTN, HLD, advanced age and hx of ocular migraine x1 in the past.      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 Alto Denver, Clarita Crane., MD  Frann Rider, AGNP-BC  [facility] [address] [place], [place] [address]  Phone [phone] Fax [phone] Note: This document was prepared with digital dictation and possible smart phrase technology. Any transcriptional errors that result from this process are unintentional.

## 2020-11-30 ENCOUNTER — Ambulatory Visit: Payer: Medicare Other | Admitting: Cardiology

## 2020-12-28 ENCOUNTER — Other Ambulatory Visit: Payer: Self-pay

## 2020-12-28 ENCOUNTER — Ambulatory Visit (INDEPENDENT_AMBULATORY_CARE_PROVIDER_SITE_OTHER): Payer: Medicare Other

## 2020-12-28 VITALS — BP 120/78 | HR 58 | Resp 18 | Ht 64.0 in | Wt 168.0 lb

## 2020-12-28 DIAGNOSIS — Z23 Encounter for immunization: Secondary | ICD-10-CM | POA: Diagnosis not present

## 2020-12-28 DIAGNOSIS — I10 Essential (primary) hypertension: Secondary | ICD-10-CM | POA: Diagnosis not present

## 2020-12-28 DIAGNOSIS — I4729 Other ventricular tachycardia: Secondary | ICD-10-CM | POA: Diagnosis not present

## 2020-12-28 LAB — LIPID PANEL
Chol/HDL Ratio: 4.2 ratio (ref 0.0–4.4)
Cholesterol, Total: 200 mg/dL — ABNORMAL HIGH (ref 100–199)
HDL: 48 mg/dL (ref >39)
LDL Chol Calc (NIH): 112 mg/dL — ABNORMAL HIGH (ref 0–99)
Triglycerides: 228 mg/dL — ABNORMAL HIGH (ref 0–149)
VLDL Cholesterol Cal: 40 mg/dL (ref 5–40)

## 2020-12-28 LAB — HEPATIC FUNCTION PANEL
ALT: 15 IU/L (ref 0–32)
AST: 23 IU/L (ref 0–40)
Albumin: 4.3 g/dL (ref 3.6–4.6)
Alkaline Phosphatase: 60 IU/L (ref 44–121)
Bilirubin Total: 0.4 mg/dL (ref 0.0–1.2)
Bilirubin, Direct: 0.12 mg/dL (ref 0.00–0.40)
Total Protein: 6.6 g/dL (ref 6.0–8.5)

## 2020-12-28 LAB — BASIC METABOLIC PANEL
BUN/Creatinine Ratio: 14 (ref 12–28)
BUN: 15 mg/dL (ref 8–27)
CO2: 26 mmol/L (ref 20–29)
Calcium: 9.8 mg/dL (ref 8.7–10.3)
Chloride: 98 mmol/L (ref 96–106)
Creatinine, Ser: 1.08 mg/dL — ABNORMAL HIGH (ref 0.57–1.00)
Glucose: 91 mg/dL (ref 70–99)
Potassium: 5.1 mmol/L (ref 3.5–5.2)
Sodium: 137 mmol/L (ref 134–144)
eGFR: 52 mL/min/{1.73_m2} — ABNORMAL LOW (ref >59)

## 2020-12-28 NOTE — Progress Notes (Signed)
Reason for visit: flu vaccine   Name of MD requesting visit: Revankar  Pt requesting a flu vaccine.

## 2020-12-29 ENCOUNTER — Telehealth: Payer: Self-pay

## 2020-12-29 ENCOUNTER — Telehealth: Payer: Self-pay | Admitting: Cardiology

## 2020-12-29 DIAGNOSIS — E785 Hyperlipidemia, unspecified: Secondary | ICD-10-CM

## 2020-12-29 DIAGNOSIS — E782 Mixed hyperlipidemia: Secondary | ICD-10-CM

## 2020-12-29 DIAGNOSIS — Z789 Other specified health status: Secondary | ICD-10-CM

## 2020-12-29 MED ORDER — ICOSAPENT ETHYL 1 G PO CAPS: 2.0000 g | ORAL_CAPSULE | Freq: Two times a day (BID) | ORAL | 3 refills | Status: DC

## 2020-12-29 NOTE — Telephone Encounter (Signed)
Spoke to the patient just now and the patients wife. She wanted to let us know that the card that [place] gave her yesterday helped them a lot and she really appreciated it.   I will route to [place] to let her know.

## 2020-12-29 NOTE — Telephone Encounter (Signed)
Patient states that her vascepa is going to cost $389 for a 3 month supply through her insurance. I let her know that we can try to do a prior auth for this to see if it will help with the cost. I will route a note to Dr. Julien Nordmann nurse and CMA to get this started.

## 2020-12-29 NOTE — Telephone Encounter (Signed)
-----   Message from Jenean Lindau, MD sent at 12/28/2020 10:10 AM EDT ----- Not sure why she is not on statin therapy.  Please check if she has any issues with it.  If not start Crestor 10 mg daily.  Liver lipid check in 6 weeks.  Fish oil 2 g twice daily.  Diet and food low in carbohydrates and fried fatty foods.  Copy primary care. Jenean Lindau, MD 12/28/2020 10:10 AM

## 2020-12-29 NOTE — Telephone Encounter (Signed)
Spoke with patient regarding results and recommendation.  Patient verbalizes understanding and is agreeable to plan of care. Advised patient to call back with any issues or concerns.    Patient is statin intolerant but is willing to try the fish oil.

## 2020-12-29 NOTE — Telephone Encounter (Signed)
Patient called and wanted to speak to [facility]

## 2020-12-30 NOTE — Telephone Encounter (Signed)
Advised we will refer her to the lipid clinic. Pt verbalized understanding and had no additional questions.

## 2020-12-30 NOTE — Addendum Note (Signed)
Addended by: Truddie Hidden on: 12/30/2020 09:28 AM   Modules accepted: Orders

## 2021-01-07 ENCOUNTER — Ambulatory Visit (INDEPENDENT_AMBULATORY_CARE_PROVIDER_SITE_OTHER): Payer: Medicare Other | Admitting: Pharmacist

## 2021-01-07 ENCOUNTER — Other Ambulatory Visit: Payer: Self-pay

## 2021-01-07 DIAGNOSIS — E782 Mixed hyperlipidemia: Secondary | ICD-10-CM | POA: Diagnosis not present

## 2021-01-07 MED ORDER — EZETIMIBE 10 MG PO TABS: 10.0000 mg | ORAL_TABLET | Freq: Every day | ORAL | 5 refills | Status: DC

## 2021-01-07 NOTE — Patient Instructions (Signed)
Your LDL cholesterol is 112 and your goal is < 70  Your triglycerides are 228 and your goal is < 150  Start taking Zetia (ezetimibe) 10mg - 1 tablet once daily  Recheck fasting cholesterol in [place] in 6 weeks  Call Jinny Blossom, PharmD with any trouble tolerating your medications

## 2021-01-07 NOTE — Progress Notes (Signed)
Patient ID: Claire Carlson                 DOB: [birth year]                    MRN: [id number]     HPI: Shatera Rennert is a 80 y.o. female patient referred to lipid clinic by Dr Geraldo Pitter. PMH is significant for aortic atherosclerosis, HTN, HLD, pre-DM and PVCs. Echo 6/22 showed normal EF. She was started on Vascepa this month but called to report copay was almost $400 for a 3 month supply so she was referred to lipid clinic.  Pt presents today in good spirits. Vascepa was cost prohibitive as she is in the donut hole. Looks like it's a Tier 4 med on her insurance plan too which has a 40% copay even when she's not in the donut hole ([place] PDP). She has tried 3 statins in the past (listed below), all of which caused cramping in her legs and hands within about 1 month of starting on them. She already has cramps at baseline, Mg supplementation and pickle juice help. Her sister has taken Zetia in the past but she has never tried it.  Current Medications: none Intolerances: atorvastatin 10mg  daily, pravastatin 10mg  daily, rosuvastatin - myalgias. Livalo and Vascepa - cost prohibitive Risk Factors: aortic atherosclerosis LDL goal: 70mg /dL  Diet: Weight Watchers. Likes sweets  Exercise: used to walk more, cares for husband now. Stays busy at home, uses fitbit - 6,000 steps a day  Family History: Father with MI  Social History: Former tobacco use, quit in 1985. Denies alcohol and drug use.   Labs: 12/27/20: TC 200, TG 228, HDL 48, LDL 112, LFTs normal (no LLT)  Past Medical History:  Diagnosis Date   Anxiety    Anxiety disorder 07/14/2015   Aortic atherosclerosis ([place]) 11/03/2019   Formatting of this note might be different from the original. [facility] 09/2019   Barrett's esophagus    Breast mass 02/07/2017   Cancer ([place]) 02/2017   right breast cancer   Complication of anesthesia    Degenerative lumbar disc 11/03/2019   Formatting of this note might be  different from the original. [facility]rays at [facility] 09/2019   Dysrhythmia    PVC's, PAC's   Essential hypertension 06/15/2015   Facial weakness 09/08/2020   GERD without esophagitis 06/15/2015   Hammertoes of both feet 07/31/2018   High risk medication use 06/15/2015   Hypercalcemia 03/16/2020   Hypertension    Hypothyroidism (acquired) 07/14/2015   Invasive ductal carcinoma of breast, female, right ([place]) 09/08/2020   Loss of hearing 06/15/2015   Last Assessment & Plan:  Relevant Hx: Course: Daily Update: Today's Plan:with some removal of dry wax and skin and recommendation to use some sweet oil for her ears and softening them up more, she was as well given some flonase to use for her eustachian tube   Electronically signed by: Mayer Camel, NP 06/15/15 2201  Last Assessment & Plan:  Formatting of this note might be different   Malignant neoplasm of central portion of left breast in female, estrogen receptor negative ([place]) 02/07/2017   Mixed hyperlipidemia 06/15/2015   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 Uses reclast.   Pain of left foot 07/31/2018   Pedal edema 10/15/2017   Polyarthralgia 02/05/2020  PONV (postoperative nausea and vomiting)    Post-menopausal 05/03/2017   Pre-ulcerative calluses 07/31/2018   Prediabetes 06/15/2015   PVC (premature ventricular contraction)    Stage 3a chronic kidney disease ([place]) 06/15/2015   Vertigo    Vitamin D deficiency 06/15/2015    Current Outpatient Medications on File Prior to Visit  Medication Sig Dispense Refill   ALPRAZolam (XANAX) 0.25 MG tablet Take 0.5-1 tablets by mouth every 8 (eight) hours as needed for anxiety.     anastrozole (ARIMIDEX) 1 MG tablet TAKE 1 TABLET(1 MG) BY MOUTH DAILY 90 tablet 3   atenolol (TENORMIN) 25 MG tablet Take 12.5 mg by mouth in the morning.      Calcium-Phosphorus-Vitamin D (CITRACAL +D3 PO) Take 1 tablet by mouth daily.     Cholecalciferol (VITAMIN D3) 50 MCG (2000 UT) TABS Take 2,000 Units by mouth in the morning.     CINNAMON PO Take 200 mg by mouth daily.     hydrochlorothiazide (HYDRODIURIL) 25 MG tablet Take 25 mg by mouth daily.     icosapent Ethyl (VASCEPA) 1 g capsule Take 2 capsules (2 g total) by mouth 2 (two) times daily. 360 capsule 3   LECITHIN PO Take 1 tablet by mouth daily.     levothyroxine (SYNTHROID, LEVOTHROID) 88 MCG tablet Take 88 mcg by mouth daily.     loratadine (CLARITIN) 10 MG tablet Take 10 mg by mouth daily as needed for allergies or rhinitis.     Magnesium 250 MG TABS Take 250 mg by mouth daily with supper.     Meclizine HCl 25 MG CHEW Chew 25 mg by mouth as needed for dizziness.     Multiple Vitamins-Calcium (ONE-A-DAY WOMENS FORMULA PO) Take 1 tablet by mouth daily with breakfast.     omeprazole (PRILOSEC) 20 MG capsule Take 20 mg by mouth daily before breakfast.     vitamin C (ASCORBIC ACID) 500 MG tablet Take 500 mg by mouth daily.     vitamin E 200 UNIT capsule Take 200 Units by mouth daily.      [DISCONTINUED] Calcium Carbonate-Vitamin D 600-400 MG-UNIT tablet Take 1 tablet by mouth daily. (Patient not taking: Reported on 09/08/2020)     No current facility-administered medications on file prior to visit.    Allergies  Allergen Reactions   Penicillin G Other (See Comments)    Welts   Rosuvastatin Other (See Comments)    Cramping of the legs   Statins Other (See Comments)    Cramping of the legs   Tape     Some tapes break out the skin- Paper tape is tolerated   Biaxin [Clarithromycin] Rash    Assessment/Plan:  1. Hyperlipidemia - LDL 112 above goal < 70 due to aortic atherosclerosis. Pt intolerant to atorvastatin 10mg  daily, pravastatin 10mg  daily and rosuvastatin. Branded meds are cost prohibitive as she is in the donut hole. Will start ezetimibe 10mg  daily. Provided pt with GoodRx  coupon which will bring copay to ~$15/month and is cheaper than her insurance price. TG are mildly elevated as well, discussed trying to cut back on sweets to help. She will have fasting labs rechecked in [place] in 6 weeks and will call with any concerns before then.  Ollin Hochmuth E. Ysabelle Goodroe, PharmD, BCACP, [place] [address], [place], [place] [address] Phone: [phone]; Fax: [phone] 01/07/2021 1:57 PM

## 2021-03-01 ENCOUNTER — Other Ambulatory Visit: Payer: Self-pay

## 2021-03-01 DIAGNOSIS — Z789 Other specified health status: Secondary | ICD-10-CM

## 2021-03-01 DIAGNOSIS — I700 Atherosclerosis of aorta: Secondary | ICD-10-CM

## 2021-03-01 DIAGNOSIS — E785 Hyperlipidemia, unspecified: Secondary | ICD-10-CM

## 2021-03-02 LAB — HEPATIC FUNCTION PANEL
ALT: 19 IU/L (ref 0–32)
AST: 27 IU/L (ref 0–40)
Albumin: 4.4 g/dL (ref 3.6–4.6)
Alkaline Phosphatase: 59 IU/L (ref 44–121)
Bilirubin Total: 0.5 mg/dL (ref 0.0–1.2)
Bilirubin, Direct: 0.14 mg/dL (ref 0.00–0.40)
Total Protein: 6.6 g/dL (ref 6.0–8.5)

## 2021-03-02 LAB — LIPID PANEL
Chol/HDL Ratio: 3.8 ratio (ref 0.0–4.4)
Cholesterol, Total: 186 mg/dL (ref 100–199)
HDL: 49 mg/dL (ref >39)
LDL Chol Calc (NIH): 113 mg/dL — ABNORMAL HIGH (ref 0–99)
Triglycerides: 135 mg/dL (ref 0–149)
VLDL Cholesterol Cal: 24 mg/dL (ref 5–40)

## 2021-03-03 MED ORDER — NEXLETOL 180 MG PO TABS: 180.0000 mg | ORAL_TABLET | Freq: Every day | ORAL | 0 refills | Status: DC

## 2021-03-08 ENCOUNTER — Encounter: Payer: Self-pay | Admitting: Cardiology

## 2021-03-08 ENCOUNTER — Encounter: Payer: Self-pay | Admitting: Oncology

## 2021-03-16 DIAGNOSIS — T466X5A Adverse effect of antihyperlipidemic and antiarteriosclerotic drugs, initial encounter: Secondary | ICD-10-CM | POA: Insufficient documentation

## 2021-03-16 DIAGNOSIS — M791 Myalgia, unspecified site: Secondary | ICD-10-CM

## 2021-03-16 HISTORY — DX: Myalgia, unspecified site: M79.10

## 2021-03-16 HISTORY — DX: Adverse effect of antihyperlipidemic and antiarteriosclerotic drugs, initial encounter: T46.6X5A

## 2021-05-26 NOTE — Progress Notes (Signed)
[facility]  [address] [place],  [place]  [address] [phone]  Clinic Day:  06/01/2021  Referring physician: Raina Mina., MD   This document serves as a record of services personally performed by Hosie Poisson, MD. It was created on their behalf by [facility] E, a trained medical scribe. The creation of this record is based on the scribe's personal observations and the provider's statements to them.  CHIEF COMPLAINT:  CC: Stage IA hormone receptor positive right breast cancer   Current Treatment:  Anastrazole 1 mg daily; Annual IV Reclast for osteopenia   HISTORY OF PRESENT ILLNESS:  Claire Carlson is a 81 y.o. female with a history of stage IA (T1c N0 M0) hormone receptor positive right breast cancer diagnosed in December 2018.  She was treated with lumpectomy in January 2019.  Pathology revealed a 1.7 cm, grade 2-3, invasive ductal carcinoma , as well as high-grade ductal carcinoma in situ with areas of close margin and lymphovascular invasion.  Estrogen and progesterone receptors were positive and her 2 Neu negative.  Ki 67 was 15%.  Three lymph nodes were negative for metastasis.  EndoPredict revealed a score of 4, which is in the high risk category, and corresponds to an 18% risk of recurrence when treated with endocrine therapy alone.  We recommended adjuvant chemotherapy with docetaxel/cyclophosphamide for 4 cycles, which she started in February.  Due to excessive toxicities, she discontinued chemotherapy after her 2nd cycle.  She had a history of osteopenia and had been receiving Reclast every 2 years prior to coming in for her breast cancer treatment.  Bone density scan in February 2019 revealed osteopenia with a T-score of -2 in the femur, which was stable when compared to 2016.  We recommended yearly Reclast due to the risk of worsening bone density associated with anastrozole.  She received Reclast in April when she recovered from  chemotherapy.  She received adjuvant radiation to the right breast completed in in May.  When she was seen in June for routine follow-up, she was placed on adjuvant hormonal therapy with anastrozole 1 mg daily, which she started July 1st 2019.  Bilateral diagnostic mammogram in November 2020 did not reveal any evidence of malignancy.  We reviewed her family history and her sister has had stage I endometrial cancer at age 14, a first cousin had pancreatic cancer at age 35, her mother had lung cancer at age 73 and her maternal grandfather had liver cancer.  Her cousin Jenny Reichmann also had malignancy of unknown type and unknown age. Her sister that had the endometrial cancer has now been diagnosed with cancer of the tongue and neck which is positive for HPV. Bone density scan from March 2021 showed stable osteopenia with a T-score of -2.0 of the left femur.  Dual femur total mean measures -1.1, previously -1.0.  The AP spine is within the normal limits with a T-score of -0.5, previously -0.4.  Mammogram in December 2021 was negative for any findings concerning for disease.  Seroma cavity at 11 o'clock in the right breast which measures 2.3 cm.  Well healed lumpectomy scar at the areolar margin in the upper inner quadrant of the right breast.  Hyperechoic nodule measuring 5 mm on ultrasound and consistent with fat necrosis.    INTERVAL HISTORY:  Claire Carlson is here for routine follow up and states that she has been well and denies complaints. She continues anastrozole daily without difficulty. Annual mammogram from December 2022 was clear. Bone  density from March 3rd, 2023 revealed mild worsening osteopenia with a T-score of -2.2, previously -2.0 of the left femur. Dual femur total mean measures -1.4, previously -1.1. AP spine is normal at -0.4. She was due for IV Reclast back in June 2022, but this was not administered and so we will schedule that for her now. She prefers to have it every other year. Blood counts are  unremarkable. Her  appetite is good, and she has lost 6 pounds since her last visit.  She denies fever, chills or other signs of infection.  She denies nausea, vomiting, bowel issues, or abdominal pain.  She denies sore throat, cough, dyspnea, or chest pain.  REVIEW OF SYSTEMS:  Review of Systems  Constitutional: Negative.  Negative for appetite change, chills, fatigue, fever and unexpected weight change.  HENT:  Negative.    Eyes: Negative.   Respiratory: Negative.  Negative for chest tightness, cough, hemoptysis, shortness of breath and wheezing.   Cardiovascular: Negative.  Negative for chest pain, leg swelling and palpitations.  Gastrointestinal: Negative.  Negative for abdominal distention, abdominal pain, blood in stool, constipation, diarrhea, nausea and vomiting.  Endocrine: Negative.   Genitourinary: Negative.  Negative for difficulty urinating, dysuria, frequency and hematuria.   Musculoskeletal: Negative.  Negative for arthralgias, back pain, flank pain, gait problem and myalgias.  Skin: Negative.   Neurological: Negative.  Negative for dizziness, extremity weakness, gait problem, headaches, light-headedness, numbness, seizures and speech difficulty.  Hematological: Negative.   Psychiatric/Behavioral: Negative.  Negative for depression and sleep disturbance. The patient is not nervous/anxious.     VITALS:  Blood pressure (!) 176/83, pulse (!) 53, temperature 97.8 F (36.6 C), temperature source Oral, resp. rate 18, height 5\' 4"  (1.626 m), weight 163 lb 6.4 oz (74.1 kg), SpO2 99 %.  Wt Readings from Last 3 Encounters:  06/01/21 163 lb 6.4 oz (74.1 kg)  12/28/20 168 lb (76.2 kg)  11/25/20 169 lb (76.7 kg)    Body mass index is 28.05 kg/m.  Performance status (ECOG): 0 - Asymptomatic  PHYSICAL EXAM:  Physical Exam Constitutional:      General: She is not in acute distress.    Appearance: Normal appearance. She is normal weight.  HENT:     Head: Normocephalic and  atraumatic.  Eyes:     General: No scleral icterus.    Extraocular Movements: Extraocular movements intact.     Conjunctiva/sclera: Conjunctivae normal.     Pupils: Pupils are equal, round, and reactive to light.  Cardiovascular:     Rate and Rhythm: Regular rhythm. Bradycardia present.     Pulses: Normal pulses.     Heart sounds: Normal heart sounds. No murmur heard.   No friction rub. No gallop.  Pulmonary:     Effort: Pulmonary effort is normal. No respiratory distress.     Breath sounds: Normal breath sounds.  Chest:     Comments: Mild fibrocystic changes in the bottom half of the left breast. Firm scar in the upper right breast which is well healed and an axillary scar which is well healed. Milder fibrocystic changes in the bottom half of the right breast. Abdominal:     General: Bowel sounds are normal. There is no distension.     Palpations: Abdomen is soft. There is no hepatomegaly, splenomegaly or mass.     Tenderness: There is no abdominal tenderness.  Musculoskeletal:        General: Normal range of motion.     Cervical back: Normal range of  motion and neck supple.     Right lower leg: No edema.     Left lower leg: No edema.  Lymphadenopathy:     Cervical: No cervical adenopathy.  Skin:    General: Skin is warm and dry.  Neurological:     General: No focal deficit present.     Mental Status: She is alert and oriented to person, place, and time. Mental status is at baseline.  Psychiatric:        Mood and Affect: Mood normal.        Behavior: Behavior normal.        Thought Content: Thought content normal.        Judgment: Judgment normal.   LABS:   CBC Latest Ref Rng & Units 11/01/2020 09/09/2020 09/08/2020  WBC - 6.0 7.9 -  Hemoglobin 12.0 - 16.0 13.9 13.6 14.3  Hematocrit 36 - 46 41 42.4 42.0  Platelets 150 - 399 273 272 -   CMP Latest Ref Rng & Units 03/01/2021 12/27/2020 11/23/2020  Glucose 70 - 99 mg/dL - 91 95  BUN 8 - 27 mg/dL - 15 24  Creatinine 0.57 - 1.00  mg/dL - 1.08(H) 1.17(H)  Sodium 134 - 144 mmol/L - 137 138  Potassium 3.5 - 5.2 mmol/L - 5.1 4.1  Chloride 96 - 106 mmol/L - 98 96  CO2 20 - 29 mmol/L - 26 25  Calcium 8.7 - 10.3 mg/dL - 9.8 10.3  Total Protein 6.0 - 8.5 g/dL 6.6 6.6 -  Total Bilirubin 0.0 - 1.2 mg/dL 0.5 0.4 -  Alkaline Phos 44 - 121 IU/L 59 60 -  AST 0 - 40 IU/L 27 23 -  ALT 0 - 32 IU/L 19 15 -    STUDIES:  No results found.    EXAM: 02/28/2021 DIGITAL DIAGNOSTIC BILATERAL MAMMOGRAM WITH TOMOSYNTHESIS AND CAD   TECHNIQUE:  Bilateral digital diagnostic mammography and breast tomosynthesis  was performed. The images were evaluated with computer-aided  detection.   COMPARISON: Previous exam(s).   ACR Breast Density Category b: There are scattered areas of  fibroglandular density.   FINDINGS:  Stable post lumpectomy changes on the right. The previously  demonstrated rounded, circumscribed area of fat necrosis at the  inferior, anterior aspect of the lumpectomy bed has not changed  significantly. No interval findings suspicious for malignancy in  either breast. No interval findings elsewhere in either breast  suspicious for malignancy.   IMPRESSION:  No evidence of malignancy.    EXAM: 05/27/2021 DUAL X-RAY ABSORPTIOMETRY (DXA) FOR BONE MINERAL DENSITY   IMPRESSION:  Claire Carlson completed a BMD test on 05/27/2021 using the [place] (analysis version: 13.60) manufactured by [employer]. The following summarizes the results of our evaluation.  Technologist: RMG   PATIENT BIOGRAPHICAL:  Name: Claire Carlson, Claire Carlson  Patient ID: [id number] [facility] Birth Date: 1940/07/08 Height: 64.5 in.  Gender: Female Exam Date: 05/27/2021 Weight: 162.6 lbs.  Indications: M85.89 Fractures: Treatments:   ASSESSMENT:  The BMD measured at Femur Neck Left is 0.733 g/cm2 with a T-score of  -2.2. This patient is considered osteopenic according to [facility]) criteria.  The scan quality  is good.  Patient does not meet criteria for FRAX assessment. Patient  currently takes a bone building therapy.  Site Region Measured Measured WHO Young Adult BMD  Date Age Classification T-score   AP Spine L1-L4 05/27/2021 82.0 Normal -0.4 1.128 g/cm2  AP Spine L1-L4 05/26/2019 80.0 Normal -0.5 1.122  g/cm2  AP Spine L1-L4 05/21/2017 78.0 Normal -0.4 1.130 g/cm2  AP Spine L1-L4 01/22/2015 75.6 Normal -0.5 1.124 g/cm2   DualFemur Neck Left 05/27/2021 82.0 Osteopenia -2.2 0.733 g/cm2  DualFemur Neck Left 05/26/2019 80.0 Osteopenia -2.0 0.760 g/cm2  DualFemur Neck Left 05/21/2017 78.0 Osteopenia -2.0 0.762 g/cm2  DualFemur Neck Left 01/22/2015 75.6 Osteopenia -2.0 0.765 g/cm2   DualFemur Total Mean 05/27/2021 82.0 Osteopenia -1.4 0.836 g/cm2  DualFemur Total Mean 05/26/2019 80.0 Osteopenia -1.1 0.864 g/cm2  DualFemur Total Mean 05/21/2017 78.0 Normal -1.0 0.881 g/cm2  DualFemur Total Mean 01/22/2015 75.6 Normal -0.9 0.895 g/cm2   Allergies:  Allergies  Allergen Reactions   Penicillin G Other (See Comments)    Welts   Rosuvastatin Other (See Comments)    Cramping of the legs   Statins Other (See Comments)    Cramping of the legs   Tape     Some tapes break out the skin- Paper tape is tolerated   Biaxin [Clarithromycin] Rash    Current Medications: Current Outpatient Medications  Medication Sig Dispense Refill   ALPRAZolam (XANAX) 0.25 MG tablet Take 0.5-1 tablets by mouth every 8 (eight) hours as needed for anxiety.     anastrozole (ARIMIDEX) 1 MG tablet TAKE 1 TABLET(1 MG) BY MOUTH DAILY 90 tablet 3   atenolol (TENORMIN) 25 MG tablet Take 12.5 mg by mouth in the morning.     Bempedoic Acid (NEXLETOL) 180 MG TABS Take 180 mg by mouth daily in the afternoon. 42 tablet 0   Calcium-Phosphorus-Vitamin D (CITRACAL +D3 PO) Take 1 tablet by mouth daily.     Cholecalciferol (VITAMIN D3) 50 MCG (2000 UT) TABS Take 2,000 Units by mouth in the morning.     CINNAMON PO Take 200 mg by mouth daily.      ezetimibe (ZETIA) 10 MG tablet Take 1 tablet (10 mg total) by mouth daily. 30 tablet 5   hydrochlorothiazide (HYDRODIURIL) 25 MG tablet Take 25 mg by mouth daily.     LECITHIN PO Take 1 tablet by mouth daily.     levothyroxine (SYNTHROID, LEVOTHROID) 88 MCG tablet Take 88 mcg by mouth daily.     loratadine (CLARITIN) 10 MG tablet Take 10 mg by mouth daily as needed for allergies or rhinitis.     Magnesium 250 MG TABS Take 250 mg by mouth daily with supper.     Meclizine HCl 25 MG CHEW Chew 25 mg by mouth as needed for dizziness.     Multiple Vitamins-Calcium (ONE-A-DAY WOMENS FORMULA PO) Take 1 tablet by mouth daily with breakfast.     omeprazole (PRILOSEC) 20 MG capsule Take 20 mg by mouth daily before breakfast.     tamsulosin (FLOMAX) 0.4 MG CAPS capsule Take 0.4 mg by mouth daily.     vitamin C (ASCORBIC ACID) 500 MG tablet Take 500 mg by mouth daily.     vitamin E 200 UNIT capsule Take 200 Units by mouth daily.      No current facility-administered medications for this visit.     ASSESSMENT & PLAN:   Assessment:   1. Stage IA hormone receptor positive breast cancer, diagnosed in December 2018, with no evidence of disease.  She was treated with surgery, partial chemotherapy, adjuvant radiation and now hormonal therapy.  She was placed on anastrozole 1 mg daily in July 2019, and will continue this for at least 5 years.  I did discuss that she should consider taking it longer since her EP clin score was high risk,  as long as she does not have unacceptable toxicities.   2. Osteopenia, she receives Reclast every other year which we will now schedule. She also continues oral calcium and vitamin D. She will be due for repeat bone density in March 2025.   3. Barrett's esophagus, followed by Dr. Lyda Jester.  4.  Hyponatremia. I advised that she increase her oral sodium intake.   Plan: As above, we will schedule her for IV Reclast in the next few weeks. She knows to continue oral calcium and  vitamin D, as well as anastrozole daily. Otherwise, we will see her back in 6 months for repeat evaluation.  She verbalizes understanding of and agreement to the plans discussed today. She knows to call the office should any new questions or concerns arise.   I provided 20 minutes of face-to-face time during this this encounter and > 50% was spent counseling as documented under my assessment and plan.    Derwood Kaplan, MD [facility] AT [facility] [address] [place] [address] Dept: [phone] Dept Fax: [phone]   I, Rita Ohara, am acting as scribe for Derwood Kaplan, MD  I have reviewed this report as typed by the medical scribe, and it is complete and accurate.

## 2021-05-27 ENCOUNTER — Encounter: Payer: Self-pay | Admitting: Oncology

## 2021-06-01 ENCOUNTER — Inpatient Hospital Stay: Payer: Medicare Other

## 2021-06-01 ENCOUNTER — Other Ambulatory Visit: Payer: Self-pay

## 2021-06-01 ENCOUNTER — Encounter: Payer: Self-pay | Admitting: Oncology

## 2021-06-01 ENCOUNTER — Inpatient Hospital Stay: Payer: Medicare Other | Attending: Oncology | Admitting: Oncology

## 2021-06-01 ENCOUNTER — Other Ambulatory Visit: Payer: Self-pay | Admitting: Pharmacist

## 2021-06-01 VITALS — BP 176/83 | HR 53 | Temp 97.8°F | Resp 18 | Ht 64.0 in | Wt 163.4 lb

## 2021-06-01 DIAGNOSIS — C50112 Malignant neoplasm of central portion of left female breast: Secondary | ICD-10-CM | POA: Diagnosis not present

## 2021-06-01 DIAGNOSIS — M8580 Other specified disorders of bone density and structure, unspecified site: Secondary | ICD-10-CM | POA: Insufficient documentation

## 2021-06-01 DIAGNOSIS — Z171 Estrogen receptor negative status [ER-]: Secondary | ICD-10-CM

## 2021-06-01 DIAGNOSIS — M8589 Other specified disorders of bone density and structure, multiple sites: Secondary | ICD-10-CM

## 2021-06-01 HISTORY — DX: Other specified disorders of bone density and structure, multiple sites: M85.89

## 2021-06-01 LAB — CBC AND DIFFERENTIAL
HCT: 40 (ref 36–46)
Hemoglobin: 13.4 (ref 12.0–16.0)
Neutrophils Absolute: 4.76
Platelets: 241 10*3/uL (ref 150–400)
WBC: 6.8

## 2021-06-01 LAB — BASIC METABOLIC PANEL
BUN: 23 — AB (ref 4–21)
CO2: 32 — AB (ref 13–22)
Chloride: 93 — AB (ref 99–108)
Creatinine: 1 (ref 0.5–1.1)
Glucose: 81
Potassium: 4.2 mEq/L (ref 3.5–5.1)
Sodium: 129 — AB (ref 137–147)

## 2021-06-01 LAB — HEPATIC FUNCTION PANEL
ALT: 25 U/L (ref 7–35)
AST: 34 (ref 13–35)
Alkaline Phosphatase: 50 (ref 25–125)
Bilirubin, Total: 0.6

## 2021-06-01 LAB — CBC: RBC: 4.88 (ref 3.87–5.11)

## 2021-06-01 LAB — COMPREHENSIVE METABOLIC PANEL
Albumin: 4.2 (ref 3.5–5.0)
Calcium: 9.5 (ref 8.7–10.7)

## 2021-06-02 ENCOUNTER — Encounter: Payer: Self-pay | Admitting: Cardiology

## 2021-06-02 ENCOUNTER — Ambulatory Visit: Payer: Medicare Other | Admitting: Cardiology

## 2021-06-02 VITALS — BP 144/62 | HR 52 | Ht 64.6 in | Wt 163.8 lb

## 2021-06-02 DIAGNOSIS — E782 Mixed hyperlipidemia: Secondary | ICD-10-CM | POA: Diagnosis not present

## 2021-06-02 DIAGNOSIS — I493 Ventricular premature depolarization: Secondary | ICD-10-CM

## 2021-06-02 DIAGNOSIS — I700 Atherosclerosis of aorta: Secondary | ICD-10-CM | POA: Diagnosis not present

## 2021-06-02 DIAGNOSIS — I10 Essential (primary) hypertension: Secondary | ICD-10-CM | POA: Diagnosis not present

## 2021-06-02 MED ORDER — LOSARTAN POTASSIUM 50 MG PO TABS: 50.0000 mg | ORAL_TABLET | Freq: Every day | ORAL | 3 refills | Status: DC

## 2021-06-02 NOTE — Patient Instructions (Signed)
Medication Instructions:  Your physician has recommended you make the following change in your medication:   Stop Hydrodiuril  Start Losartan 50 mg daily.  *If you need a refill on your cardiac medications before your next appointment, please call your pharmacy*   Lab Work: Your physician recommends that you return for lab work in: 2 weeks for a BMET. You do not need to fast.  If you have labs (blood work) drawn today and your tests are completely normal, you will receive your results only by: [place] (if you have MyChart) OR A paper copy in the mail If you have any lab test that is abnormal or we need to change your treatment, we will call you to review the results.   Testing/Procedures: None ordered   Follow-Up: At [facility], you and your health needs are our priority.  As part of our continuing mission to provide you with exceptional heart care, we have created designated Provider Care Teams.  These Care Teams include your primary Cardiologist (physician) and Advanced Practice Providers (APPs -  Physician Assistants and Nurse Practitioners) who all work together to provide you with the care you need, when you need it.  We recommend signing up for the patient portal called "MyChart".  Sign up information is provided on this After Visit Summary.  MyChart is used to connect with patients for Virtual Visits (Telemedicine).  Patients are able to view lab/test results, encounter notes, upcoming appointments, etc.  Non-urgent messages can be sent to your provider as well.   To learn more about what you can do with MyChart, go to [email or web address].    Your next appointment:   9 month(s)  The format for your next appointment:   In Person  Provider:   Jyl Heinz, MD   Other Instructions  Blood Pressure Record Sheet To take your blood pressure, you will need a blood pressure machine. You can buy a blood pressure machine (blood pressure monitor) at your clinic,  drug store, or online. When choosing one, consider: An automatic monitor that has an arm cuff. A cuff that wraps snugly around your upper arm. You should be able to fit only one finger between your arm and the cuff. A device that stores blood pressure reading results. Do not choose a monitor that measures your blood pressure from your wrist or finger. Follow your health care provider's instructions for how to take your blood pressure. To use this form: Get one reading in the morning (a.m.) 1-2 hours after you take any medicines. Get one reading in the evening (p.m.) before supper. Take at least 2 readings with each blood pressure check. This makes sure the results are correct. Wait 1-2 minutes between measurements. Write down the results in the spaces on this form. Repeat this once a week, or as told by your health care provider.  Make a follow-up appointment with your health care provider to discuss the results. Blood pressure log Date: _______________________ a.m. _____________________(1st reading) HR___________            p.m. _____________________(2nd reading) HR__________  Date: _______________________ a.m. _____________________(1st reading) HR___________            p.m. _____________________(2nd reading) HR__________ Date: _______________________ a.m. _____________________(1st reading) HR___________            p.m. _____________________(2nd reading) HR__________ Date: _______________________ a.m. _____________________(1st reading) HR___________            p.m. _____________________(2nd reading) HR__________  Date: _______________________ a.m. _____________________(1st reading) HR___________  p.m. _____________________(2nd reading) HR__________  Date: _______________________ a.m. _____________________(1st reading) HR___________            p.m. _____________________(2nd reading) HR__________  Date: _______________________ a.m. _____________________(1st  reading) HR___________            p.m. _____________________(2nd reading) HR__________   This information is not intended to replace advice given to you by your health care provider. Make sure you discuss any questions you have with your health care provider. Document Revised: 07/02/2019 Document Reviewed: 07/02/2019 Elsevier Patient Education  2021 [place].   Losartan Tablets What is this medication? LOSARTAN (loe SAR tan) treats high blood pressure. It may also be used to prevent a stroke in people with heart disease and high blood pressure. It can be used to prevent kidney damage in people with diabetes. It works by relaxing the blood vessels, which helps decrease the amount of work your heart has to do. It belongs to a group of medications called ARBs. This medicine may be used for other purposes; ask your health care provider or pharmacist if you have questions. COMMON BRAND NAME(S): Cozaar What should I tell my care team before I take this medication? They need to know if you have any of these conditions: Heart failure Kidney disease Liver disease An unusual or allergic reaction to losartan, other medications, foods, dyes, or preservatives Pregnant or trying to get pregnant Breast-feeding How should I use this medication? Take this medication by mouth. Take it as directed on the prescription label at the same time every day. You can take it with or without food. If it upsets your stomach, take it with food. Keep taking it unless your care team tells you to stop. Talk to your care team about the use of this medication in children. While it may be prescribed for children as young as 6 for selected conditions, precautions do apply. Overdosage: If you think you have taken too much of this medicine contact a poison control center or emergency room at once. NOTE: This medicine is only for you. Do not share this medicine with others. What if I miss a dose? If you miss a dose, take it  as soon as you can. If it is almost time for your next dose, take only that dose. Do not take double or extra doses. What may interact with this medication? Aliskiren ACE inhibitors, like enalapril or lisinopril Diuretics, especially amiloride, eplerenone, spironolactone, or triamterene Lithium NSAIDs, medications for pain and inflammation, like ibuprofen or naproxen Potassium salts or potassium supplements This list may not describe all possible interactions. Give your health care provider a list of all the medicines, herbs, non-prescription drugs, or dietary supplements you use. Also tell them if you smoke, drink alcohol, or use illegal drugs. Some items may interact with your medicine. What should I watch for while using this medication? Visit your care team for regular check ups. Check your blood pressure as directed. Ask your care team what your blood pressure should be. Also, find out when you should contact them. Do not treat yourself for coughs, colds, or pain while you are using this medication without asking your care team for advice. Some medications may increase your blood pressure. Women should inform their care team if they wish to become pregnant or think they might be pregnant. There is a potential for serious side effects to an unborn child. Talk to your care team for more information. You may get drowsy or dizzy. Do not drive, use machinery, or do  anything that needs mental alertness until you know how this medication affects you. Do not stand or sit up quickly, especially if you are an older patient. This reduces the risk of dizzy or fainting spells. Alcohol can make you more drowsy and dizzy. Avoid alcoholic drinks. Avoid salt substitutes unless you are told otherwise by your care team. What side effects may I notice from receiving this medication? Side effects that you should report to your care team as soon as possible: Allergic reactions--skin rash, itching, hives, swelling of  the face, lips, tongue, or throat High potassium level--muscle weakness, fast or irregular heartbeat Kidney injury--decrease in the amount of urine, swelling of the ankles, hands, or feet Low blood pressure--dizziness, feeling faint or lightheaded, blurry vision Side effects that usually do not require medical attention (report to your care team if they continue or are bothersome): Dizziness Headache Runny or stuffy nose This list may not describe all possible side effects. Call your doctor for medical advice about side effects. You may report side effects to FDA at 1-800-FDA-1088. Where should I keep my medication? Keep out of the reach of children and pets. Store at room temperature between 20 and 25 degrees C (68 and 77 degrees F). Protect from light. Keep the container tightly closed. Get rid of any unused medication after the expiration date. To get rid of medications that are no longer needed or have expired: Take the medication to a medication take-back program. Check with your pharmacy or law enforcement to find a location. If you cannot return the medication, check the label or package insert to see if the medication should be thrown out in the garbage or flushed down the toilet. If you are not sure, ask your care team. If it is safe to put in the trash, empty the medication out of the container. Mix the medication with cat litter, dirt, coffee grounds, or other unwanted substance. Seal the mixture in a bag or container. Put it in the trash. NOTE: This sheet is a summary. It may not cover all possible information. If you have questions about this medicine, talk to your doctor, pharmacist, or health care provider.  2022 Elsevier/Gold Standard (2020-11-30 00:00:00)

## 2021-06-02 NOTE — Progress Notes (Signed)
Cardiology Office Note:    Date:  06/02/2021   ID:  Claire Carlson, DOB [birth year], MRN [id number]  PCP:  Raina Mina., MD  Cardiologist:  Jenean Lindau, MD   Referring MD: Raina Mina., MD    ASSESSMENT:    1. Essential hypertension   2. Mixed hyperlipidemia   3. PVC (premature ventricular contraction)   4. Aortic atherosclerosis (HCC)    PLAN:    In order of problems listed above:  Primary prevention stressed with the patient.  Importance of compliance with diet medication stressed and she vocalized understanding. Essential hypertension: She has brought lab work with her and this reveals hyponatremia.  I would like to change her diuretic and she is agreeable.  I switched it to losartan 50 mg daily.  She will keep a track of her pulse blood pressure for a week and come back for a Chem-7 and nurse visit in 2 weeks.  At that time she will bring [place] the vital signs log for review.  Essential hypertension, lifestyle modification, salt intake issues and diet emphasized.  She vocalized understanding and questions were answered to her satisfaction. Mixed dyslipidemia: Diet was emphasized.  She could not tolerate Nexletol.  She is not keen on any further lipid lowering medications.  She is going to do her best with diet and Zetia.  I respect her wishes. Aortic atherosclerosis: We will monitor this. Patient will be seen in follow-up appointment in 9 months or earlier if the patient has any concerns    Medication Adjustments/Labs and Tests Ordered: Current medicines are reviewed at length with the patient today.  Concerns regarding medicines are outlined above.  Orders Placed This Encounter  Procedures   Basic metabolic panel   Meds ordered this encounter  Medications   losartan (COZAAR) 50 MG tablet    Sig: Take 1 tablet (50 mg total) by mouth daily.    Dispense:  90 tablet    Refill:  3     No chief complaint on file.    History of Present Illness:    Claire Carlson is a 81 y.o. female.  Patient has past medical history of essential hypertension dyslipidemia and aortic atherosclerosis.  She denies any problems at this time and takes care of activities of daily living.  No chest pain orthopnea or PND.  At the time of my evaluation, the patient is alert awake oriented and in no distress.  Past Medical History:  Diagnosis Date   Anxiety    Anxiety disorder 07/14/2015   Aortic atherosclerosis ([place]) 11/03/2019   Formatting of this note might be different from the original. seen on x-rays at [facility] 09/2019   Barrett's esophagus    Breast mass 02/07/2017   Cancer ([place]) 02/2017   right breast cancer   Complication of anesthesia    Degenerative lumbar disc 11/03/2019   Formatting of this note might be different from the original. 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   Loss of hearing 06/15/2015   Last Assessment & Plan:  Relevant Hx: Course: Daily Update: Today's Plan:with some removal of dry wax and skin and  recommendation to use some sweet oil for her ears and softening them up more, she was as well given some flonase to use for her eustachian tube   Electronically signed by: Mayer Camel, NP 06/15/15 2201  Last Assessment & Plan:  Formatting of this note might be different   Malignant neoplasm of central portion of left breast in female, estrogen receptor negative ([place]) 02/07/2017   Mixed hyperlipidemia 06/15/2015   Myalgia due to statin 03/16/2021   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 Uses reclast.   Pain of left foot 07/31/2018   Pedal edema 10/15/2017   Polyarthralgia 02/05/2020   PONV (postoperative nausea and vomiting)    Post-menopausal 05/03/2017   Pre-ulcerative calluses 07/31/2018   Prediabetes 06/15/2015   PVC (premature ventricular contraction)    Stage 3a chronic kidney disease ([place]) 06/15/2015   Vertigo    Vitamin D deficiency 06/15/2015    Past Surgical History:  Procedure Laterality Date   BREAST LUMPECTOMY WITH RADIOACTIVE SEED AND SENTINEL LYMPH NODE BIOPSY Right 04/06/2017   Procedure: RIGHT BREAST LUMPECTOMY WITH RADIOACTIVE SEED X 2 AND SENTINEL LYMPH NODE BIOPSY;  Surgeon: Excell Seltzer, MD;  Location: [facility];  Service: General;  Laterality: Right;   CESAREAN SECTION     CHOLECYSTECTOMY     EYE SURGERY     cataracts   MOUTH SURGERY     TONSILLECTOMY      Current Medications: Current Meds  Medication Sig   ALPRAZolam (XANAX) 0.25 MG tablet Take 0.5-1 tablets by mouth every 8 (eight) hours as needed for anxiety.   anastrozole (ARIMIDEX) 1 MG tablet TAKE 1 TABLET(1 MG) BY MOUTH DAILY   atenolol (TENORMIN) 25 MG tablet Take 12.5 mg by mouth in the morning.   Calcium Carbonate-Vitamin D (CALTRATE 600+D PO) Take 1 tablet by mouth daily.   Cholecalciferol (VITAMIN D3) 50 MCG (2000 UT) TABS Take 2,000 Units by mouth in the morning.   ezetimibe (ZETIA) 10 MG tablet Take 1 tablet (10 mg total) by mouth daily.   LECITHIN PO Take 1 tablet by mouth daily.   levothyroxine (SYNTHROID, LEVOTHROID) 88 MCG tablet Take 88 mcg by mouth daily.   loratadine (CLARITIN) 10 MG tablet Take 10 mg by mouth daily as needed for allergies or rhinitis.   losartan (COZAAR) 50 MG tablet Take 1 tablet (50 mg total) by mouth daily.   Magnesium 250 MG TABS Take 250 mg by mouth daily with supper.   Meclizine HCl 25 MG CHEW Chew 25 mg by mouth as needed for dizziness.    Multiple Vitamins-Calcium (ONE-A-DAY WOMENS FORMULA PO) Take 1 tablet by mouth daily with breakfast.   omeprazole (PRILOSEC) 20 MG capsule Take 20 mg by mouth daily before breakfast.   vitamin C (ASCORBIC ACID) 500 MG tablet Take 500 mg by mouth daily.   vitamin E 200 UNIT capsule Take 400 Units by mouth daily.   [DISCONTINUED] hydrochlorothiazide (HYDRODIURIL) 25 MG tablet Take 25 mg by mouth daily.     Allergies:   Penicillin g, Rosuvastatin, Statins, Tape, and Biaxin [clarithromycin]   Social History   Socioeconomic History   Marital status: Married    Spouse name: Not on file   Number of children: Not on file   Years of education: Not on file   Highest education level: Not on file  Occupational History  Not on file  Tobacco Use   Smoking status: Former    Types: Cigarettes    Quit date: 1985    Years since quitting: 38.2   Smokeless tobacco: Never  Vaping Use   Vaping Use: Never used  Substance and Sexual Activity   Alcohol use: No   Drug use: No   Sexual activity: Not on file  Other Topics Concern   Not on file  Social History Narrative   Not on file   Social Determinants of Health   Financial Resource Strain: Not on file  Food Insecurity: Not on file  Transportation Needs: Not on file  Physical Activity: Not on file  Stress: Not on file  Social Connections: Not on file     Family History: The patient's family history includes Heart attack in her father.  ROS:   Please see the history of present illness.    All other systems reviewed and are negative.  EKGs/Labs/Other Studies Reviewed:    The following studies were reviewed today: I discussed my findings with the patient at length.   Recent Labs: 06/01/2021: ALT 25; BUN 23; Creatinine 1.0; Hemoglobin 13.4; Platelets 241; Potassium 4.2; Sodium 129  Recent Lipid Panel    Component Value Date/Time   CHOL 186 03/01/2021 0941   TRIG 135 03/01/2021 0941   HDL 49 03/01/2021 0941   CHOLHDL 3.8  03/01/2021 0941   CHOLHDL 4.2 09/09/2020 0203   VLDL 38 09/09/2020 0203   LDLCALC 113 (H) 03/01/2021 0941    Physical Exam:    VS:  BP (!) 144/62    Pulse (!) 52    Ht 5' 4.6" (1.641 m)    Wt 163 lb 12.8 oz (74.3 kg)    SpO2 94%    BMI 27.60 kg/m     Wt Readings from Last 3 Encounters:  06/02/21 163 lb 12.8 oz (74.3 kg)  06/01/21 163 lb 6.4 oz (74.1 kg)  12/28/20 168 lb (76.2 kg)     GEN: Patient is in no acute distress HEENT: Normal NECK: No JVD; No carotid bruits LYMPHATICS: No lymphadenopathy CARDIAC: Hear sounds regular, 2/6 systolic murmur at the apex. RESPIRATORY:  Clear to auscultation without rales, wheezing or rhonchi  ABDOMEN: Soft, non-tender, non-distended MUSCULOSKELETAL:  No edema; No deformity  SKIN: Warm and dry NEUROLOGIC:  Alert and oriented x 3 PSYCHIATRIC:  Normal affect   Signed, Jenean Lindau, MD  06/02/2021 11:35 AM    [place]

## 2021-06-06 ENCOUNTER — Encounter: Payer: Self-pay | Admitting: Oncology

## 2021-06-09 ENCOUNTER — Inpatient Hospital Stay: Payer: Medicare Other

## 2021-06-09 ENCOUNTER — Other Ambulatory Visit: Payer: Self-pay

## 2021-06-09 VITALS — BP 176/64 | HR 55 | Temp 98.3°F | Resp 18 | Ht 63.78 in | Wt 163.0 lb

## 2021-06-09 DIAGNOSIS — M8589 Other specified disorders of bone density and structure, multiple sites: Secondary | ICD-10-CM

## 2021-06-09 DIAGNOSIS — M8580 Other specified disorders of bone density and structure, unspecified site: Secondary | ICD-10-CM | POA: Diagnosis present

## 2021-06-09 MED ORDER — SODIUM CHLORIDE 0.9 % IV SOLN: Freq: Once | INTRAVENOUS | Status: AC

## 2021-06-09 MED ORDER — ZOLEDRONIC ACID 4 MG/100ML IV SOLN
4.0000 mg | Freq: Once | INTRAVENOUS | Status: AC
  Administered 2021-06-09: 4 mg via INTRAVENOUS
  Filled 2021-06-09: qty 100

## 2021-06-09 NOTE — Patient Instructions (Signed)

## 2021-06-09 NOTE — Progress Notes (Signed)
1122:PT STABLE AT TIME OF DISCHARGE ?

## 2021-06-10 ENCOUNTER — Telehealth: Payer: Self-pay

## 2021-06-10 NOTE — Telephone Encounter (Signed)
Patient increase her Losartan to 100 mg daily and bring in blood pressure log in 1 week and will return for Chem 7 in 1 week as well. Patient thanked me for the call and will start the increased dose tomorrow morning. ?

## 2021-06-10 NOTE — Addendum Note (Signed)
Addended by: Bhumi Godbey, Jonelle Sidle L on: 06/10/2021 05:16 PM ? ? Modules accepted: Orders ? ?

## 2021-06-10 NOTE — Telephone Encounter (Signed)
Pt states that since her medication change her BP has running high. Pt has sent a list of BP and they have been printed fro you to review. ?

## 2021-06-13 ENCOUNTER — Telehealth: Payer: Self-pay | Admitting: Cardiology

## 2021-06-13 NOTE — Telephone Encounter (Signed)
Patient is requesting to speak with Reuben Likes about her BP meds and a procedure she has for tomorrow.  ?

## 2021-06-13 NOTE — Telephone Encounter (Signed)
Spoke with pt who states that she is scheduled for her endoscopy tomorrow morning. Pt is concerned that if she takes her BP medication at 3:30 am it will not sustain her BP all day. Advised to call tomorrow afternoon and let us know what her BP is and we will advise her how move forward. Pt verbalized understanding and had no additional questions. ?

## 2021-06-14 ENCOUNTER — Telehealth: Payer: Self-pay

## 2021-06-14 MED ORDER — CLONIDINE HCL 0.1 MG PO TABS: 0.1000 mg | ORAL_TABLET | Freq: Once | ORAL | 6 refills | Status: DC

## 2021-06-14 NOTE — Telephone Encounter (Signed)
Today pt called to state that her BP prior to the procedure was in the 200's. Pt states that the anesthesiologist gave her IV medications for her BP. Pt states that her BP at 2:00 was 140/66 and at 1600 it was 153/70. Pt is concerned what her BP will do tonight as she took her medications at 3:30 this am. Pt states she we took her off of her diuretic her BP has been elevated. Please advise.  ? ? ?Spoke with pt who states that she is scheduled for her endoscopy tomorrow morning. Pt is concerned that if she takes her BP medication at 3:30 am it will not sustain her BP all day. Advised to call tomorrow afternoon and let us know what her BP is and we will advise her how move forward. Pt verbalized understanding and had no additional questions. ?

## 2021-06-14 NOTE — Telephone Encounter (Signed)
Spoke with pt and advised to start Clonidine 0.1 mg daily and to take BP/HR three times daily. Pt verbalized understanding and had no additional questions. ?

## 2021-06-16 ENCOUNTER — Other Ambulatory Visit: Payer: Self-pay

## 2021-06-16 ENCOUNTER — Ambulatory Visit: Payer: Medicare Other

## 2021-06-16 VITALS — BP 180/96 | HR 56 | Ht 63.78 in | Wt 164.0 lb

## 2021-06-16 DIAGNOSIS — I10 Essential (primary) hypertension: Secondary | ICD-10-CM

## 2021-06-16 MED ORDER — CLONIDINE HCL 0.1 MG PO TABS: 0.1000 mg | ORAL_TABLET | Freq: Two times a day (BID) | ORAL | Status: DC

## 2021-06-16 NOTE — Progress Notes (Signed)
? ?  Nurse Visit  ?  ?Date of Encounter: 06/16/2021 ?IDBettyjo Carlson, DOB [birth year], MRN [id number] ? ?PCP:  Raina Mina., MD ?  ?Cardiologist:  Revankar ?Advanced Practice Provider:  No care team member to display ?Electrophysiologist:  None  ?   ? ? ?Visit Details  ? ?VS:  BP (!) 180/96 (BP Location: Left Arm, Patient Position: Sitting, Cuff Size: Normal)   Pulse (!) 56   Ht 5' 3.78" (1.62 m)   Wt 164 lb (74.4 kg)   SpO2 99%   BMI 28.35 kg/m?  , BMI Body mass index is 28.35 kg/m?. ? ?Wt Readings from Last 3 Encounters:  ?06/16/21 164 lb (74.4 kg)  ?06/09/21 163 lb (73.9 kg)  ?06/02/21 163 lb 12.8 oz (74.3 kg)  ?  ? ?Reason for visit: Blood pressure check and labs ?Performed today: Vitals, Provider consulted:Revankar, and Education ?Changes (medications, testing, etc.) : Increase your clonidine to twice daily. ?Length of Visit: 5 minutes ? ?Medications Adjustments/Labs and Tests Ordered: ?No orders of the defined types were placed in this encounter. ? ?No orders of the defined types were placed in this encounter. ? ? ?Signed, ?Truddie Hidden, RN  ?06/16/2021 10:30 AM ? ? ? ? ?

## 2021-06-17 ENCOUNTER — Telehealth: Payer: Self-pay

## 2021-06-17 LAB — BASIC METABOLIC PANEL
BUN/Creatinine Ratio: 18 (ref 12–28)
BUN: 18 mg/dL (ref 8–27)
CO2: 26 mmol/L (ref 20–29)
Calcium: 9.5 mg/dL (ref 8.7–10.3)
Chloride: 99 mmol/L (ref 96–106)
Creatinine, Ser: 0.98 mg/dL (ref 0.57–1.00)
Glucose: 70 mg/dL (ref 70–99)
Potassium: 5.1 mmol/L (ref 3.5–5.2)
Sodium: 136 mmol/L (ref 134–144)
eGFR: 58 mL/min/{1.73_m2} — ABNORMAL LOW (ref >59)

## 2021-06-19 ENCOUNTER — Telehealth: Payer: Self-pay | Admitting: Physician Assistant

## 2021-06-19 NOTE — Telephone Encounter (Signed)
Paged by answering service.  Recent medication changes reviewed.  This morning patient's blood pressure is normal but reported heart rate of 47 on blood pressure cuff and Kardia device.  Patient is lightheaded but not severely dizzy or syncope episode.  I have advised to keep hydrated and balance before movement.  She already took her atenolol this morning.  If symptoms get worse she will go to ER.  Otherwise, patient will monitor her heart rate and blood pressure and call us tomorrow morning.  She will hold her atenolol if HR below 50 and still lightheaded.  ?

## 2021-06-20 NOTE — Telephone Encounter (Signed)
Spoke with pt who states that her BP is better at times but that her heart rate is in the 40's at times. Pt reports feeling very tired and falls asleep a lot. Dr. Geraldo Pitter aware and advised pt to stop Clonidine in the am and start Amlodipine 5 mg daily. ? ?Pt verbalized understanding and had no additional questions. ?

## 2021-06-21 MED ORDER — AMLODIPINE BESYLATE 10 MG PO TABS: 10.0000 mg | ORAL_TABLET | Freq: Every day | ORAL | 3 refills | Status: DC

## 2021-06-21 NOTE — Telephone Encounter (Signed)
Patient is following up, requesting to speak with Reuben Likes, RN regarding medication. Declined discussing in detail with me--states Reuben Likes will know what's going on. Please return call as able. ?

## 2021-06-21 NOTE — Addendum Note (Signed)
Addended by: Truddie Hidden on: 06/21/2021 03:22 PM ? ? Modules accepted: Orders ? ?

## 2021-06-21 NOTE — Telephone Encounter (Signed)
States that she was able to sleep last pm and felt better today. ? ?

## 2021-06-22 ENCOUNTER — Telehealth: Payer: Self-pay | Admitting: Cardiology

## 2021-06-22 MED ORDER — AMLODIPINE BESYLATE 5 MG PO TABS: 5.0000 mg | ORAL_TABLET | Freq: Every day | ORAL | 1 refills | Status: DC

## 2021-06-22 NOTE — Telephone Encounter (Addendum)
Spoke to the patient advised of the following per Dr. Geraldo Pitter on 06/19/2020: ? ?June 20, 2021 ?Truddie Hidden, RN ?  ?  12:31 PM ?Note ?Spoke with pt who states that her BP is better at times but that her heart rate is in the 40's at times. Pt reports feeling very tired and falls asleep a lot. Dr. Geraldo Pitter aware and advised pt to stop Clonidine in the am and start Amlodipine 5 mg daily. ?  ?Pt verbalized understanding and had no additional questions.  ?  ? ? ?Script was originally sent on 03/28 for '10mg'$ , I called the pharmacy, spoke with Terry(pharmacy tech) cancelled Amlodipine '10mg'$  request and sent Amlodipine '5mg'$  qd.  ?

## 2021-06-22 NOTE — Telephone Encounter (Signed)
Patient wants to confirm the dosage on the amlodipine.  ?

## 2021-06-23 NOTE — Telephone Encounter (Signed)
3/28 197/79 ?3/29 0730 161/78 HR51 ?        0930 161/80 HR 53  ?        1530 147/72 HR 52 ?        2130 152/72 HR 53 ?3/30 0730 161/78 HR 53 ?        0930  139/70 HR 53 ?        1530  131/64 HR 55 ?Pt reports that she feels much better today. ?  ?

## 2021-06-24 ENCOUNTER — Telehealth: Payer: Self-pay

## 2021-06-24 NOTE — Telephone Encounter (Signed)
Spoke with pt and advised her per MD to continue her current medications. She stated that she felt good today and her morning blood pressure was 130/70 ?

## 2021-06-29 NOTE — Telephone Encounter (Signed)
Error

## 2021-07-06 ENCOUNTER — Other Ambulatory Visit: Payer: Self-pay

## 2021-07-06 MED ORDER — EZETIMIBE 10 MG PO TABS: 10.0000 mg | ORAL_TABLET | Freq: Every day | ORAL | 3 refills | Status: DC

## 2021-07-06 MED ORDER — AMLODIPINE BESYLATE 5 MG PO TABS: 5.0000 mg | ORAL_TABLET | Freq: Every day | ORAL | 3 refills | Status: DC

## 2021-07-06 MED ORDER — LOSARTAN POTASSIUM 100 MG PO TABS: 100.0000 mg | ORAL_TABLET | Freq: Every day | ORAL | 3 refills | Status: DC

## 2021-07-19 NOTE — Telephone Encounter (Signed)
Spoke with pt and advised her BP numbers look good. Pt verbalized understanding and had no questions. ?

## 2021-07-19 NOTE — Telephone Encounter (Signed)
Pt states that her BP is better it runs from 123-169 over 66-78. Pt states she is not sure what the goal is for her numbers but they are better than before. ?

## 2021-12-02 ENCOUNTER — Encounter: Payer: Self-pay | Admitting: Oncology

## 2021-12-02 ENCOUNTER — Inpatient Hospital Stay: Payer: Medicare Other | Attending: Oncology | Admitting: Oncology

## 2021-12-02 ENCOUNTER — Other Ambulatory Visit: Payer: Medicare Other

## 2021-12-02 VITALS — BP 160/71 | HR 56 | Temp 97.8°F | Resp 16 | Ht 63.78 in | Wt 166.8 lb

## 2021-12-02 DIAGNOSIS — C50112 Malignant neoplasm of central portion of left female breast: Secondary | ICD-10-CM | POA: Diagnosis not present

## 2021-12-02 DIAGNOSIS — Z171 Estrogen receptor negative status [ER-]: Secondary | ICD-10-CM | POA: Diagnosis not present

## 2021-12-02 NOTE — Progress Notes (Signed)
Claire Carlson  [address] [place],  [place]  [address] [phone]  Clinic Day:  12/02/2021  Referring physician: Raina Mina., MD   CHIEF COMPLAINT:  CC: Stage IA hormone receptor positive right breast cancer   Current Treatment:  Anastrazole 1 mg daily; Annual IV Reclast for osteopenia   HISTORY OF PRESENT ILLNESS:  Claire Carlson is a 81 y.o. female with a history of stage IA (T1c N0 M0) hormone receptor positive right breast cancer diagnosed in December 2018.  She was treated with lumpectomy in January 2019.  Pathology revealed a 1.7 cm, grade 2-3, invasive ductal carcinoma , as well as high-grade ductal carcinoma in situ with areas of close margin and lymphovascular invasion.  Estrogen and progesterone receptors were positive and her 2 Neu negative.  Ki 67 was 15%.  Three lymph nodes were negative for metastasis.  EndoPredict revealed a score of 4, which is in the high risk category, and corresponds to an 18% risk of recurrence when treated with endocrine therapy alone.  We recommended adjuvant chemotherapy with docetaxel/cyclophosphamide for 4 cycles, which she started in February.  Due to excessive toxicities, she discontinued chemotherapy after her 2nd cycle.  She had a history of osteopenia and had been receiving Reclast every 2 years prior to coming in for her breast cancer treatment.  Bone density scan in February 2019 revealed osteopenia with a T-score of -2 in the femur, which was stable when compared to 2016.  We recommended yearly Reclast due to the risk of worsening bone density associated with anastrozole.  She received Reclast in April when she recovered from chemotherapy.  She received adjuvant radiation to the right breast completed in in May.  When she was seen in June for routine follow-up, she was placed on adjuvant hormonal therapy with anastrozole 1 mg daily, which she started July 1st 2019.  Bilateral diagnostic mammogram in  November 2020 did not reveal any evidence of malignancy.  We reviewed her family history and her sister has had stage I endometrial cancer at age 56, a first cousin had pancreatic cancer at age 85, her mother had lung cancer at age 46 and her maternal grandfather had liver cancer.  Her cousin Jenny Reichmann also had malignancy of unknown type and unknown age. Her sister that had the endometrial cancer has now been diagnosed with cancer of the tongue and neck which is positive for HPV. Bone density scan from March 2021 showed stable osteopenia with a T-score of -2.0 of the left femur.  Dual femur total mean measures -1.1, previously -1.0.  The AP spine is within the normal limits with a T-score of -0.5, previously -0.4.  Mammogram in December 2021 was negative for any findings concerning for disease.  There is a seroma cavity at 11 o'clock in the right breast which measures 2.3 cm and a well healed lumpectomy scar at the areolar margin in the upper inner quadrant of the right breast.  Interval history [name] is here for routine follow up and states that she has been well and denies complaints. She continues anastrozole daily without difficulty. Annual mammogram from December 2022 was clear. Bone density from March 3rd, 2023 revealed mild worsening osteopenia with a T-score of -2.2, previously -2.0 of the left femur. Dual femur total mean measures -1.4, previously -1.1. AP spine is normal at -0.4. She was due for IV Reclast back in June 2022, but this was not administered until March of 2023. She prefers to have it  every other year. Blood counts are unremarkable. Her  appetite is good, and she has gained 2 pounds since her last visit.  She denies fever, chills or other signs of infection.  She denies nausea, vomiting, bowel issues, or abdominal pain.  She denies sore throat, cough, dyspnea, or chest pain.  REVIEW OF SYSTEMS:  Review of Systems  Constitutional: Negative.  Negative for appetite change, chills, fatigue,  fever and unexpected weight change.  HENT:  Negative.    Eyes: Negative.   Respiratory: Negative.  Negative for chest tightness, cough, hemoptysis, shortness of breath and wheezing.   Cardiovascular: Negative.  Negative for chest pain, leg swelling and palpitations.  Gastrointestinal: Negative.  Negative for abdominal distention, abdominal pain, blood in stool, constipation, diarrhea, nausea and vomiting.  Endocrine: Negative.   Genitourinary: Negative.  Negative for difficulty urinating, dysuria, frequency and hematuria.   Musculoskeletal: Negative.  Negative for arthralgias, back pain, flank pain, gait problem and myalgias.  Skin: Negative.   Neurological: Negative.  Negative for dizziness, extremity weakness, gait problem, headaches, light-headedness, numbness, seizures and speech difficulty.  Hematological: Negative.   Psychiatric/Behavioral: Negative.  Negative for depression and sleep disturbance. The patient is not nervous/anxious.      VITALS:  Blood pressure (!) 160/71, pulse (!) 56, temperature 97.8 F (36.6 C), temperature source Oral, resp. rate 16, height 5' 3.78" (1.62 m), weight 166 lb 12.8 oz (75.7 kg), SpO2 98 %.  Wt Readings from Last 3 Encounters:  12/02/21 166 lb 12.8 oz (75.7 kg)  06/16/21 164 lb (74.4 kg)  06/09/21 163 lb (73.9 kg)    Body mass index is 28.83 kg/m.  Performance status (ECOG): 0 - Asymptomatic  PHYSICAL EXAM:  Physical Exam Constitutional:      General: She is not in acute distress.    Appearance: Normal appearance. She is normal weight.  HENT:     Head: Normocephalic and atraumatic.  Eyes:     General: No scleral icterus.    Extraocular Movements: Extraocular movements intact.     Conjunctiva/sclera: Conjunctivae normal.     Pupils: Pupils are equal, round, and reactive to light.  Cardiovascular:     Rate and Rhythm: Regular rhythm. Bradycardia present.     Pulses: Normal pulses.     Heart sounds: Normal heart sounds. No murmur  heard.    No friction rub. No gallop.  Pulmonary:     Effort: Pulmonary effort is normal. No respiratory distress.     Breath sounds: Normal breath sounds.  Chest:     Comments: Mild fibrocystic changes in the bottom half of the left breast. Firm scar in the upper right breast which is well healed and an axillary scar which is well healed. Milder fibrocystic changes in the bottom half of the right breast. Abdominal:     General: Bowel sounds are normal. There is no distension.     Palpations: Abdomen is soft. There is no hepatomegaly, splenomegaly or mass.     Tenderness: There is no abdominal tenderness.  Musculoskeletal:        General: Normal range of motion.     Cervical back: Normal range of motion and neck supple.     Right lower leg: No edema.     Left lower leg: No edema.  Lymphadenopathy:     Cervical: No cervical adenopathy.  Skin:    General: Skin is warm and dry.  Neurological:     General: No focal deficit present.     Mental Status: She  is alert and oriented to person, place, and time. Mental status is at baseline.  Psychiatric:        Mood and Affect: Mood normal.        Behavior: Behavior normal.        Thought Content: Thought content normal.        Judgment: Judgment normal.    LABS:      Latest Ref Rng & Units 06/01/2021   12:00 AM 11/01/2020   12:00 AM 09/09/2020    2:03 AM  CBC  WBC  6.8  6.0     7.9   Hemoglobin 12.0 - 16.0 13.4  13.9     13.6   Hematocrit 36 - 46 40  41     42.4   Platelets 150 - 400 K/uL 241  273     272      This result is from an external source.      Latest Ref Rng & Units 06/16/2021   10:33 AM 06/01/2021   12:00 AM 03/01/2021    9:43 AM  CMP  Glucose 70 - 99 mg/dL 70     BUN 8 - 27 mg/dL 18  23    Creatinine 0.57 - 1.00 mg/dL 0.98  1.0    Sodium 134 - 144 mmol/L 136  129    Potassium 3.5 - 5.2 mmol/L 5.1  4.2    Chloride 96 - 106 mmol/L 99  93    CO2 20 - 29 mmol/L 26  32    Calcium 8.7 - 10.3 mg/dL 9.5  9.5    Total  Protein 6.0 - 8.5 g/dL   6.6   Total Bilirubin 0.0 - 1.2 mg/dL   0.5   Alkaline Phos 25 - 125  50  59   AST 13 - 35  34  27   ALT 7 - 35 U/L  25  19     STUDIES:  No results found.    EXAM: 02/28/2021 DIGITAL DIAGNOSTIC BILATERAL MAMMOGRAM WITH TOMOSYNTHESIS AND CAD   TECHNIQUE:  Bilateral digital diagnostic mammography and breast tomosynthesis  was performed. The images were evaluated with computer-aided  detection.   COMPARISON: Previous exam(s).   ACR Breast Density Category b: There are scattered areas of  fibroglandular density.   FINDINGS:  Stable post lumpectomy changes on the right. The previously  demonstrated rounded, circumscribed area of fat necrosis at the  inferior, anterior aspect of the lumpectomy bed has not changed  significantly. No interval findings suspicious for malignancy in  either breast. No interval findings elsewhere in either breast  suspicious for malignancy.   IMPRESSION:  No evidence of malignancy.    EXAM: 05/27/2021 DUAL X-RAY ABSORPTIOMETRY (DXA) FOR BONE MINERAL DENSITY   IMPRESSION:  Estephani Vezina completed a BMD test on 05/27/2021 using the Plover (analysis version: 13.60) manufactured by [employer]. The following summarizes the results of our evaluation.  Technologist: RMG   PATIENT BIOGRAPHICAL:  Name: LASYA, VETTER  Patient ID: [id number] [facility] Birth Date: 1941-01-14 Height: 64.5 in.  Gender: Female Exam Date: 05/27/2021 Weight: 162.6 lbs.  Indications: M85.89 Fractures: Treatments:   ASSESSMENT:  The BMD measured at Femur Neck Left is 0.733 g/cm2 with a T-score of  -2.2. This patient is considered osteopenic according to [facility]) criteria.  The scan quality is good.  Patient does not meet criteria for FRAX assessment. Patient  currently takes a bone building therapy.  Site Region Measured  Measured WHO Young Adult BMD  Date Age Classification T-score   AP Spine L1-L4  05/27/2021 82.0 Normal -0.4 1.128 g/cm2  AP Spine L1-L4 05/26/2019 80.0 Normal -0.5 1.122 g/cm2  AP Spine L1-L4 05/21/2017 78.0 Normal -0.4 1.130 g/cm2  AP Spine L1-L4 01/22/2015 75.6 Normal -0.5 1.124 g/cm2   DualFemur Neck Left 05/27/2021 82.0 Osteopenia -2.2 0.733 g/cm2  DualFemur Neck Left 05/26/2019 80.0 Osteopenia -2.0 0.760 g/cm2  DualFemur Neck Left 05/21/2017 78.0 Osteopenia -2.0 0.762 g/cm2  DualFemur Neck Left 01/22/2015 75.6 Osteopenia -2.0 0.765 g/cm2   DualFemur Total Mean 05/27/2021 82.0 Osteopenia -1.4 0.836 g/cm2  DualFemur Total Mean 05/26/2019 80.0 Osteopenia -1.1 0.864 g/cm2  DualFemur Total Mean 05/21/2017 78.0 Normal -1.0 0.881 g/cm2  DualFemur Total Mean 01/22/2015 75.6 Normal -0.9 0.895 g/cm2   Allergies:  Allergies  Allergen Reactions   Penicillin G Other (See Comments)    Welts   Rosuvastatin Other (See Comments)    Cramping of the legs   Statins Other (See Comments)    Cramping of the legs   Tape     Some tapes break out the skin- Paper tape is tolerated   Biaxin [Clarithromycin] Rash    Current Medications: Current Outpatient Medications  Medication Sig Dispense Refill   ALPRAZolam (XANAX) 0.25 MG tablet Take 0.5-1 tablets by mouth every 8 (eight) hours as needed for anxiety.     amLODipine (NORVASC) 5 MG tablet Take 1 tablet (5 mg total) by mouth daily. 90 tablet 3   anastrozole (ARIMIDEX) 1 MG tablet TAKE 1 TABLET(1 MG) BY MOUTH DAILY 90 tablet 3   atenolol (TENORMIN) 25 MG tablet Take 12.5 mg by mouth in the morning.     Calcium Carbonate-Vitamin D (CALTRATE 600+D PO) Take 1 tablet by mouth daily.     Cholecalciferol (VITAMIN D3) 50 MCG (2000 UT) TABS Take 2,000 Units by mouth in the morning.     ezetimibe (ZETIA) 10 MG tablet Take 1 tablet (10 mg total) by mouth daily. 90 tablet 3   LECITHIN PO Take 1 tablet by mouth daily.     levothyroxine (SYNTHROID, LEVOTHROID) 88 MCG tablet Take 88 mcg by mouth daily.     loratadine (CLARITIN) 10 MG tablet Take 10 mg by  mouth daily as needed for allergies or rhinitis.     losartan (COZAAR) 100 MG tablet Take 1 tablet (100 mg total) by mouth daily. 90 tablet 3   Magnesium 250 MG TABS Take 250 mg by mouth daily with supper.     Meclizine HCl 25 MG CHEW Chew 25 mg by mouth as needed for dizziness.     Multiple Vitamins-Calcium (ONE-A-DAY WOMENS FORMULA PO) Take 1 tablet by mouth daily with breakfast.     omeprazole (PRILOSEC) 20 MG capsule Take 20 mg by mouth daily before breakfast.     vitamin C (ASCORBIC ACID) 500 MG tablet Take 500 mg by mouth daily.     vitamin E 200 UNIT capsule Take 400 Units by mouth daily.     No current facility-administered medications for this visit.     ASSESSMENT & PLAN:   Assessment:   1. Stage IA hormone receptor positive breast cancer, diagnosed in December 2018, with no evidence of disease.  She was treated with surgery, partial chemotherapy, adjuvant radiation and now hormonal therapy.  She was placed on anastrozole 1 mg daily in July 2019, and will continue this for at least 5 years.  I did discuss that she should consider taking it longer since her  EP clin score was high risk, as long as she does not have unacceptable toxicities.   2. Osteopenia, she receives Reclast every other year which we will now schedule. She also continues oral calcium and vitamin D. She will be due for repeat bone density in March 2025.   3. Barrett's esophagus, followed by Dr. Lyda Jester.  4.  Hyponatremia. I advised that she increase her oral sodium intake.   Plan: As above, we will schedule her for IV Reclast in the next few weeks. She knows to continue oral calcium and vitamin D, as well as anastrozole daily. Otherwise, we will see her back in 6 months for repeat evaluation.  She verbalizes understanding of and agreement to the plans discussed today. She knows to call the office should any new questions or concerns arise.   I provided 20 minutes of face-to-face time during this this encounter  and > 50% was spent counseling as documented under my assessment and plan.    Derwood Kaplan, MD [facility] [address] [place] [address] Dept: [phone] Dept Fax: [phone]   I, Rita Ohara, am acting as scribe for Derwood Kaplan, MD  I have reviewed this report as typed by the medical scribe, and it is complete and accurate.

## 2021-12-21 ENCOUNTER — Encounter: Payer: Self-pay | Admitting: Oncology

## 2022-01-11 ENCOUNTER — Other Ambulatory Visit: Payer: Self-pay | Admitting: Oncology

## 2022-01-11 DIAGNOSIS — C50112 Malignant neoplasm of central portion of left female breast: Secondary | ICD-10-CM

## 2022-02-23 ENCOUNTER — Other Ambulatory Visit: Payer: Self-pay

## 2022-02-28 ENCOUNTER — Encounter: Payer: Self-pay | Admitting: Cardiology

## 2022-02-28 ENCOUNTER — Ambulatory Visit: Payer: Medicare Other | Attending: Cardiology | Admitting: Cardiology

## 2022-02-28 VITALS — BP 128/70 | HR 56 | Ht 64.6 in | Wt 168.0 lb

## 2022-02-28 DIAGNOSIS — I10 Essential (primary) hypertension: Secondary | ICD-10-CM

## 2022-02-28 DIAGNOSIS — I4729 Other ventricular tachycardia: Secondary | ICD-10-CM | POA: Diagnosis not present

## 2022-02-28 DIAGNOSIS — I700 Atherosclerosis of aorta: Secondary | ICD-10-CM

## 2022-02-28 DIAGNOSIS — E875 Hyperkalemia: Secondary | ICD-10-CM | POA: Insufficient documentation

## 2022-02-28 DIAGNOSIS — E782 Mixed hyperlipidemia: Secondary | ICD-10-CM

## 2022-02-28 NOTE — Progress Notes (Signed)
Cardiology Office Note:    Date:  02/28/2022   ID:  Claire Carlson, DOB February 28, 1941, MRN [id number]  PCP:  Raina Mina., MD  Cardiologist:  Jenean Lindau, MD   Referring MD: Raina Mina., MD    ASSESSMENT:    1. Aortic atherosclerosis ([place])   2. Essential hypertension   3. Nonsustained ventricular tachycardia ([place])   4. Hyperkalemia   5. Mixed hyperlipidemia    PLAN:    In order of problems listed above:  Primary prevention stressed with the patient.  Importance of compliance with diet medication stressed and she vocalized understanding Aortic atherosclerosis: Stable.  Secondary prevention stressed. Mixed dyslipidemia: Intolerance to statins.  She is on ezetimibe.  She does not want to pursue any other medical therapy for lipid lowering.  Numbers were explained to her she vocalized understanding.  Risks explained.  She promises to do better with exercise and diet. Nonsustained ventricular tachycardia by history: Stable and asymptomatic.  We will continue to monitor. Hypokalemia: Recent blood work had revealed elevated potassium so we will check this today.  Diet emphasized. Patient will be seen in follow-up appointment in 9 months or earlier if the patient has any concerns    Medication Adjustments/Labs and Tests Ordered: Current medicines are reviewed at length with the patient today.  Concerns regarding medicines are outlined above.  Orders Placed This Encounter  Procedures   Basic metabolic panel   EKG [id number]   No orders of the defined types were placed in this encounter.    No chief complaint on file.    History of Present Illness:    Claire Carlson is a 81 y.o. female.  Patient has past medical history of aortic atherosclerosis, mixed dyslipidemia, nonsustained ventricular tachycardia and essential hypertension.  She has history of overall a sedentary lifestyle.  She ambulates age appropriately.  She is not on statins because of myalgia.  She denies  any symptoms.  She takes care of activities of daily living.  At the time of my evaluation, the patient is alert awake oriented and in no distress.  Past Medical History:  Diagnosis Date   Anxiety    Anxiety disorder 07/14/2015   Aortic atherosclerosis ([place]) 11/03/2019   Formatting of this note might be different from the original. seen on x-rays at [facility] 09/2019   Barrett's esophagus    Breast mass 02/07/2017   Cancer ([place]) 02/2017   right breast cancer   Complication of anesthesia    Degenerative lumbar disc 11/03/2019   Formatting of this note might be different from the original. xrays at [facility] 09/2019   Dysrhythmia    PVC's, PAC's   Essential hypertension 06/15/2015   Facial weakness 09/08/2020   GERD without esophagitis 06/15/2015   Hammertoes of both feet 07/31/2018   High risk medication use 06/15/2015   Hypercalcemia 03/16/2020   Hyperlipidemia 06/15/2015   Hypertension    Hypothyroidism (acquired) 07/14/2015   Invasive ductal carcinoma of breast, female, right ([place]) 09/08/2020   Loss of hearing 06/15/2015   Last Assessment & Plan:  Relevant Hx: Course: Daily Update: Today's Plan:with some removal of dry wax and skin and recommendation to use some sweet oil for her ears and softening them up more, she was as well given some flonase to use for her eustachian tube   Electronically signed by: Mayer Camel, NP 06/15/15 2201  Last Assessment & Plan:  Formatting of this note might be different   Malignant neoplasm of central portion of left  breast in female, estrogen receptor negative ([place]) 02/07/2017   Mixed hyperlipidemia 06/15/2015   Myalgia due to statin 03/16/2021   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 Uses reclast.   Other specified disorders of bone density and structure, multiple sites 06/01/2021    Pain of left foot 07/31/2018   Pedal edema 10/15/2017   Polyarthralgia 02/05/2020   PONV (postoperative nausea and vomiting)    Post-menopausal 05/03/2017   Pre-ulcerative calluses 07/31/2018   Prediabetes 06/15/2015   PVC (premature ventricular contraction)    Stage 3a chronic kidney disease ([place]) 06/15/2015   Vertigo    Vitamin D deficiency 06/15/2015    Past Surgical History:  Procedure Laterality Date   BREAST LUMPECTOMY WITH RADIOACTIVE SEED AND SENTINEL LYMPH NODE BIOPSY Right 04/06/2017   Procedure: RIGHT BREAST LUMPECTOMY WITH RADIOACTIVE SEED X 2 AND SENTINEL LYMPH NODE BIOPSY;  Surgeon: Excell Seltzer, MD;  Location: [facility];  Service: General;  Laterality: Right;   CESAREAN SECTION     CHOLECYSTECTOMY     EYE SURGERY     cataracts   MOUTH SURGERY     TONSILLECTOMY      Current Medications: Current Meds  Medication Sig   ALPRAZolam (XANAX) 0.25 MG tablet Take 0.5-1 tablets by mouth every 8 (eight) hours as needed for anxiety.   anastrozole (ARIMIDEX) 1 MG tablet TAKE 1 TABLET(1 MG) BY MOUTH DAILY   atenolol (TENORMIN) 25 MG tablet Take 12.5 mg by mouth in the morning.   BIOTIN 5000 PO Take 5,000 mg by mouth daily.   Calcium Carbonate-Vitamin D (CALTRATE 600+D PO) Take 1 tablet by mouth daily.   Cholecalciferol (VITAMIN D3) 50 MCG (2000 UT) TABS Take 2,000 Units by mouth in the morning.   ezetimibe (ZETIA) 10 MG tablet Take 1 tablet (10 mg total) by mouth daily.   LECITHIN PO Take 1 tablet by mouth daily.   levothyroxine (SYNTHROID, LEVOTHROID) 88 MCG tablet Take 88 mcg by mouth daily.   loratadine (CLARITIN) 10 MG tablet Take 10 mg by mouth daily as needed for allergies or rhinitis.   losartan-hydrochlorothiazide (HYZAAR) 100-12.5 MG tablet Take 1 tablet by mouth daily.   Magnesium 250 MG TABS Take 250 mg by mouth daily with supper.   Meclizine HCl 25 MG CHEW Chew 25 mg by mouth as needed for dizziness.   Multiple Vitamins-Calcium (ONE-A-DAY  WOMENS FORMULA PO) Take 1 tablet by mouth daily with breakfast.   omeprazole (PRILOSEC) 20 MG capsule Take 20 mg by mouth daily before breakfast.   vitamin C (ASCORBIC ACID) 500 MG tablet Take 500 mg by mouth daily.   vitamin E 200 UNIT capsule Take 400 Units by mouth daily.   [DISCONTINUED] losartan (COZAAR) 100 MG tablet Take 1 tablet (100 mg total) by mouth daily.     Allergies:   Penicillin g, Rosuvastatin, Statins, Tape, and Biaxin [clarithromycin]   Social History   Socioeconomic History   Marital status: Married    Spouse name: Not on file   Number of children: Not on file   Years of education: Not on file   Highest education level: Not on file  Occupational History   Not on file  Tobacco Use   Smoking status: Former    Types: Cigarettes    Quit date: 1985    Years since quitting: 38.9   Smokeless tobacco:  Never  Vaping Use   Vaping Use: Never used  Substance and Sexual Activity   Alcohol use: No   Drug use: No   Sexual activity: Not on file  Other Topics Concern   Not on file  Social History Narrative   Not on file   Social Determinants of Health   Financial Resource Strain: Not on file  Food Insecurity: Not on file  Transportation Needs: Not on file  Physical Activity: Not on file  Stress: Not on file  Social Connections: Not on file     Family History: The patient's family history includes Heart attack in her father.  ROS:   Please see the history of present illness.    All other systems reviewed and are negative.  EKGs/Labs/Other Studies Reviewed:    The following studies were reviewed today: EKG reveals sinus rhythm and nonspecific ST-T changes   Recent Labs: 06/01/2021: ALT 25; Hemoglobin 13.4; Platelets 241 06/16/2021: BUN 18; Creatinine, Ser 0.98; Potassium 5.1; Sodium 136  Recent Lipid Panel    Component Value Date/Time   CHOL 186 03/01/2021 0941   TRIG 135 03/01/2021 0941   HDL 49 03/01/2021 0941   CHOLHDL 3.8 03/01/2021 0941    CHOLHDL 4.2 09/09/2020 0203   VLDL 38 09/09/2020 0203   LDLCALC 113 (H) 03/01/2021 0941    Physical Exam:    VS:  BP 128/70   Pulse (!) 56   Ht 5' 4.6" (1.641 m)   Wt 168 lb (76.2 kg)   SpO2 97%   BMI 28.30 kg/m     Wt Readings from Last 3 Encounters:  02/28/22 168 lb (76.2 kg)  12/02/21 166 lb 12.8 oz (75.7 kg)  06/16/21 164 lb (74.4 kg)     GEN: Patient is in no acute distress HEENT: Normal NECK: No JVD; No carotid bruits LYMPHATICS: No lymphadenopathy CARDIAC: Hear sounds regular, 2/6 systolic murmur at the apex. RESPIRATORY:  Clear to auscultation without rales, wheezing or rhonchi  ABDOMEN: Soft, non-tender, non-distended MUSCULOSKELETAL:  No edema; No deformity  SKIN: Warm and dry NEUROLOGIC:  Alert and oriented x 3 PSYCHIATRIC:  Normal affect   Signed, Jenean Lindau, MD  02/28/2022 1:45 PM    [facility]

## 2022-02-28 NOTE — Patient Instructions (Signed)
Medication Instructions:  Your physician recommends that you continue on your current medications as directed. Please refer to the Current Medication list given to you today.  *If you need a refill on your cardiac medications before your next appointment, please call your pharmacy*   Lab Work: None ordered If you have labs (blood work) drawn today and your tests are completely normal, you will receive your results only by: MyChart Message (if you have MyChart) OR A paper copy in the mail If you have any lab test that is abnormal or we need to change your treatment, we will call you to review the results.   Testing/Procedures: None ordered   Follow-Up: At [facility], you and your health needs are our priority.  As part of our continuing mission to provide you with exceptional heart care, we have created designated Provider Care Teams.  These Care Teams include your primary Cardiologist (physician) and Advanced Practice Providers (APPs -  Physician Assistants and Nurse Practitioners) who all work together to provide you with the care you need, when you need it.  We recommend signing up for the patient portal called "MyChart".  Sign up information is provided on this After Visit Summary.  MyChart is used to connect with patients for Virtual Visits (Telemedicine).  Patients are able to view lab/test results, encounter notes, upcoming appointments, etc.  Non-urgent messages can be sent to your provider as well.   To learn more about what you can do with MyChart, go to [email or web address].    Your next appointment:   9 month(s)  The format for your next appointment:   In Person  Provider:   Rajan Revankar, MD    Other Instructions none  Important Information About Sugar       

## 2022-03-01 LAB — BASIC METABOLIC PANEL
BUN/Creatinine Ratio: 22 (ref 12–28)
BUN: 22 mg/dL (ref 8–27)
CO2: 25 mmol/L (ref 20–29)
Calcium: 10 mg/dL (ref 8.7–10.3)
Chloride: 99 mmol/L (ref 96–106)
Creatinine, Ser: 1 mg/dL (ref 0.57–1.00)
Glucose: 87 mg/dL (ref 70–99)
Potassium: 4.6 mmol/L (ref 3.5–5.2)
Sodium: 138 mmol/L (ref 134–144)
eGFR: 56 mL/min/{1.73_m2} — ABNORMAL LOW (ref >59)

## 2022-03-09 ENCOUNTER — Encounter: Payer: Self-pay | Admitting: Oncology

## 2022-03-23 ENCOUNTER — Other Ambulatory Visit: Payer: Self-pay | Admitting: Cardiology

## 2022-03-23 NOTE — Telephone Encounter (Signed)
Rx refill sent to pharmacy. 

## 2022-05-10 ENCOUNTER — Telehealth: Payer: Self-pay

## 2022-05-10 NOTE — Telephone Encounter (Signed)
Patient states she took her blood pressure and it was 131/69 with a heart rate of 44.  She took a shower and stripped her bed and now feels like a dish rag and felt like she was going to pass out.  She also took her blood pressure last night and it was 186/82 with heart rate of 51 and 186/79 with a heart rate of 52.  Is going to try lying down for a while to see if it helps renew her strength.  Would like to know if Dr. Geraldo Pitter has any recommendations.

## 2022-05-10 NOTE — Telephone Encounter (Signed)
Informed patient of Dr. Julien Nordmann recommendations.  She informed she was diagnosed with COVID and is on her her 4th day of medication for this.  I strongly encouraged her to go to the urgent care of ER if she did not feel better.  She thanked me for the call and had no additional questions.

## 2022-06-02 ENCOUNTER — Inpatient Hospital Stay: Payer: Medicare Other | Attending: Oncology | Admitting: Oncology

## 2022-06-02 ENCOUNTER — Telehealth: Payer: Self-pay | Admitting: Oncology

## 2022-06-02 VITALS — BP 146/66 | HR 61 | Temp 97.9°F | Resp 18 | Ht 64.6 in | Wt 168.2 lb

## 2022-06-02 DIAGNOSIS — M8580 Other specified disorders of bone density and structure, unspecified site: Secondary | ICD-10-CM | POA: Diagnosis not present

## 2022-06-02 DIAGNOSIS — C50112 Malignant neoplasm of central portion of left female breast: Secondary | ICD-10-CM | POA: Diagnosis not present

## 2022-06-02 DIAGNOSIS — Z171 Estrogen receptor negative status [ER-]: Secondary | ICD-10-CM | POA: Diagnosis not present

## 2022-06-02 NOTE — Telephone Encounter (Signed)
Patient has been scheduled for follow-up visit per 06/02/22 LOS.  Pt given an appt calendar with date and time.

## 2022-06-02 NOTE — Progress Notes (Signed)
[facility]  [address] 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 female with a history of stage IA (T1c N0 M0) hormone receptor positive right breast cancer diagnosed in December 2018.  She was treated with lumpectomy in January 2019.  Pathology revealed a 1.7 cm, grade 2-3, invasive ductal carcinoma , as well as high-grade ductal carcinoma in situ with areas of close margin and lymphovascular invasion.  Estrogen and progesterone receptors were positive and her 2 Neu negative.  Ki 67 was 15%.  Three lymph nodes were negative for metastasis.  EndoPredict revealed a score of 4, which is in the high risk category, and corresponds to an 18% risk of recurrence when treated with endocrine therapy alone.  We recommended adjuvant chemotherapy with docetaxel/cyclophosphamide for 4 cycles, which she started in February.  Due to excessive toxicities, she discontinued chemotherapy after her 2nd cycle.  She had a history of osteopenia and had been receiving Reclast every 2 years prior to coming in for her breast cancer treatment.  Bone density scan in February 2019 revealed osteopenia with a T-score of -2 in the femur, which was stable when compared to 2016.  We recommended yearly Reclast due to the risk of worsening bone density associated with anastrozole.  She received Reclast in April when she recovered from chemotherapy.  She received adjuvant radiation to the right breast completed in in May.  When she was seen in June for routine follow-up, she was placed on adjuvant hormonal therapy with anastrozole 1 mg daily, which she started July 1st 2019.  Bilateral diagnostic mammogram in November  2020 did not reveal any evidence of malignancy.  We reviewed her family history and her sister has had stage I endometrial cancer at age 81, a first cousin had pancreatic cancer at age 64, her mother had lung cancer at age 76 and her maternal grandfather had liver cancer.  Her cousin Jenny Reichmann also had malignancy of unknown type and unknown age. Her sister that had the endometrial cancer has now been diagnosed with cancer of the tongue and neck which is positive for HPV. Bone density scan from March 2021 showed stable osteopenia with a T-score of -2.0 of the left femur.  Dual femur total mean measures -1.1, previously -1.0.  The AP spine is within the normal limits with a T-score of -0.5, previously -0.4.  Mammogram in December 2021 was negative for any findings concerning for disease.  There is a seroma cavity at 11 o'clock in the right breast which measures 2.3 cm and a well healed lumpectomy scar at the areolar margin in the upper inner quadrant of the right breast.  Interval history Claire Carlson is here for routine follow up and she denies complaints.She did have a mild case of COVID last month but has had no energy since then. She continues anastrozole daily without difficulty. Annual mammogram from December 2023 was clear. Bone density from March 3rd, 2023 revealed mild worsening osteopenia with a T-score of -2.2, previously -2.0 of the left femur. Dual femur total mean measures -1.4, previously -1.1. AP spine is normal at -0.4. She was due for IV Reclast back in June 2022, but this was not  administered until March of 2023. She prefers to have it every other year. Her  appetite is good, and her weight is stable since her last visit.  She denies fever, chills or other signs of infection.  She denies nausea, vomiting, bowel issues, or abdominal pain.  She denies sore throat, cough, dyspnea, or chest pain.  REVIEW OF SYSTEMS:  Review of Systems  Constitutional: Negative.  Negative for appetite change, chills,  fatigue, fever and unexpected weight change.  HENT:  Negative.    Eyes: Negative.   Respiratory: Negative.  Negative for chest tightness, cough, hemoptysis, shortness of breath and wheezing.   Cardiovascular: Negative.  Negative for chest pain, leg swelling and palpitations.  Gastrointestinal: Negative.  Negative for abdominal distention, abdominal pain, blood in stool, constipation, diarrhea, nausea and vomiting.  Endocrine: Negative.   Genitourinary: Negative.  Negative for difficulty urinating, dysuria, frequency and hematuria.   Musculoskeletal: Negative.  Negative for arthralgias, back pain, flank pain, gait problem and myalgias.  Skin: Negative.   Neurological: Negative.  Negative for dizziness, extremity weakness, gait problem, headaches, light-headedness, numbness, seizures and speech difficulty.  Hematological: Negative.   Psychiatric/Behavioral: Negative.  Negative for depression and sleep disturbance. The patient is not nervous/anxious.      VITALS:  Blood pressure (!) 146/66, pulse 61, temperature 97.9 F (36.6 C), temperature source Oral, resp. rate 18, height 5' 4.6" (1.641 m), weight 168 lb 3.2 oz (76.3 kg), SpO2 100 %.  Wt Readings from Last 3 Encounters:  06/02/22 168 lb 3.2 oz (76.3 kg)  02/28/22 168 lb (76.2 kg)  12/02/21 166 lb 12.8 oz (75.7 kg)    Body mass index is 28.34 kg/m.  Performance status (ECOG): 0 - Asymptomatic  PHYSICAL EXAM:  Physical Exam Constitutional:      General: She is not in acute distress.    Appearance: Normal appearance. She is normal weight.  HENT:     Head: Normocephalic and atraumatic.  Eyes:     General: No scleral icterus.    Extraocular Movements: Extraocular movements intact.     Conjunctiva/sclera: Conjunctivae normal.     Pupils: Pupils are equal, round, and reactive to light.  Cardiovascular:     Rate and Rhythm: Regular rhythm. Bradycardia present.     Pulses: Normal pulses.     Heart sounds: Normal heart sounds. No  murmur heard.    No friction rub. No gallop.  Pulmonary:     Effort: Pulmonary effort is normal. No respiratory distress.     Breath sounds: Normal breath sounds.  Chest:     Comments: Mild fibrocystic changes in the bottom half of the left breast. Firm scar in the upper right breast which is well healed and an axillary scar which is well healed. Milder fibrocystic changes in the bottom half of the right breast. Abdominal:     General: Bowel sounds are normal. There is no distension.     Palpations: Abdomen is soft. There is no hepatomegaly, splenomegaly or mass.     Tenderness: There is no abdominal tenderness.  Musculoskeletal:        General: Normal range of motion.     Cervical back: Normal range of motion and neck supple.     Right lower leg: No edema.     Left lower leg: No edema.  Lymphadenopathy:     Cervical: No cervical adenopathy.  Skin:    General: Skin is warm and dry.  Neurological:     General: No focal deficit present.  Mental Status: She is alert and oriented to person, place, and time. Mental status is at baseline.  Psychiatric:        Mood and Affect: Mood normal.        Behavior: Behavior normal.        Thought Content: Thought content normal.        Judgment: Judgment normal.    LABS:      Latest Ref Rng & Units 06/01/2021   12:00 AM 11/01/2020   12:00 AM 09/09/2020    2:03 AM  CBC  WBC  6.8  6.0     7.9   Hemoglobin 12.0 - 16.0 13.4  13.9     13.6   Hematocrit 36 - 46 40  41     42.4   Platelets 150 - 400 K/uL 241  273     272      This result is from an external source.      Latest Ref Rng & Units 02/28/2022    1:29 PM 06/16/2021   10:33 AM 06/01/2021   12:00 AM  CMP  Glucose 70 - 99 mg/dL 87  70    BUN 8 - 27 mg/dL 22  18  23    Creatinine 0.57 - 1.00 mg/dL 1.00  0.98  1.0   Sodium 134 - 144 mmol/L 138  136  129   Potassium 3.5 - 5.2 mmol/L 4.6  5.1  4.2   Chloride 96 - 106 mmol/L 99  99  93   CO2 20 - 29 mmol/L 25  26  32   Calcium 8.7 -  10.3 mg/dL 10.0  9.5  9.5   Alkaline Phos 25 - 125   50   AST 13 - 35   34   ALT 7 - 35 U/L   25     STUDIES:   EXAM: 03/01/22 MAMMOGRAM BILATERAL TOMO SCREENING  IMPRESSION: No mammographic evidence of malignancy. Recommend repeat screening mammogram in 1 year.    EXAM: 05/27/2021 DUAL X-RAY ABSORPTIOMETRY (DXA) FOR BONE MINERAL DENSITY   IMPRESSION:  Claire Carlson completed a BMD test on 05/27/2021 using the [place] (analysis version: 13.60) manufactured by [employer]. The following summarizes the results of our evaluation.  Technologist: RMG   PATIENT BIOGRAPHICAL:  Name: Claire Carlson, Claire Carlson  Patient ID: [id number] [facility] Birth Date: 1940-05-22 Height: 64.5 in.  Gender: Female Exam Date: 05/27/2021 Weight: 162.6 lbs.  Indications: M85.89 Fractures: Treatments:   ASSESSMENT:  The BMD measured at Femur Neck Left is 0.733 g/cm2 with a T-score of  -2.2. This patient is considered osteopenic according to [facility]) criteria.  The scan quality is good.  Patient does not meet criteria for FRAX assessment. Patient  currently takes a bone building therapy.  Site Region Measured Measured WHO Young Adult BMD  Date Age Classification T-score   AP Spine L1-L4 05/27/2021 82.0 Normal -0.4 1.128 g/cm2  AP Spine L1-L4 05/26/2019 80.0 Normal -0.5 1.122 g/cm2  AP Spine L1-L4 05/21/2017 78.0 Normal -0.4 1.130 g/cm2  AP Spine L1-L4 01/22/2015 75.6 Normal -0.5 1.124 g/cm2   DualFemur Neck Left 05/27/2021 82.0 Osteopenia -2.2 0.733 g/cm2  DualFemur Neck Left 05/26/2019 80.0 Osteopenia -2.0 0.760 g/cm2  DualFemur Neck Left 05/21/2017 78.0 Osteopenia -2.0 0.762 g/cm2  DualFemur Neck Left 01/22/2015 75.6 Osteopenia -2.0 0.765 g/cm2   DualFemur Total Mean 05/27/2021 82.0 Osteopenia -1.4 0.836 g/cm2  DualFemur Total Mean 05/26/2019 80.0 Osteopenia -1.1 0.864 g/cm2  DualFemur Total  Mean 05/21/2017 78.0 Normal -1.0 0.881 g/cm2  DualFemur Total Mean 01/22/2015 75.6  Normal -0.9 0.895 g/cm2   Allergies:  Allergies  Allergen Reactions   Other Other (See Comments)    Some tapes break out the skin- Paper tape is tolerated   Penicillin G Other (See Comments)    Welts  welts   Rosuvastatin Other (See Comments)    Cramping of the legs   Statins Other (See Comments)    Cramping of the legs   Tape     Some tapes break out the skin- Paper tape is tolerated   Biaxin [Clarithromycin] Rash    Current Medications: Current Outpatient Medications  Medication Sig Dispense Refill   ALPRAZolam (XANAX) 0.25 MG tablet Take 0.5-1 tablets by mouth every 8 (eight) hours as needed for anxiety.     anastrozole (ARIMIDEX) 1 MG tablet TAKE 1 TABLET(1 MG) BY MOUTH DAILY 90 tablet 3   atenolol (TENORMIN) 25 MG tablet Take 12.5 mg by mouth in the morning.     BIOTIN 5000 PO Take 5,000 mg by mouth daily.     Calcium Carbonate-Vitamin D (CALTRATE 600+D PO) Take 1 tablet by mouth daily.     Cholecalciferol (VITAMIN D3) 50 MCG (2000 UT) TABS Take 2,000 Units by mouth in the morning.     ezetimibe (ZETIA) 10 MG tablet Take 1 tablet (10 mg total) by mouth daily. 90 tablet 3   LECITHIN PO Take 1 tablet by mouth daily.     levothyroxine (SYNTHROID, LEVOTHROID) 88 MCG tablet Take 88 mcg by mouth daily.     loratadine (CLARITIN) 10 MG tablet Take 10 mg by mouth daily as needed for allergies or rhinitis.     losartan-hydrochlorothiazide (HYZAAR) 100-12.5 MG tablet Take 1 tablet by mouth daily.     Magnesium 250 MG TABS Take 250 mg by mouth daily with supper.     Meclizine HCl 25 MG CHEW Chew 25 mg by mouth as needed for dizziness.     Multiple Vitamins-Calcium (ONE-A-DAY WOMENS FORMULA PO) Take 1 tablet by mouth daily with breakfast.     omeprazole (PRILOSEC) 20 MG capsule Take 20 mg by mouth daily before breakfast.     vitamin C (ASCORBIC ACID) 500 MG tablet Take 500 mg by mouth daily.     vitamin E 200 UNIT capsule Take 400 Units by mouth daily.     No current  facility-administered medications for this visit.     ASSESSMENT & PLAN:   Assessment:   1. Stage IA hormone receptor positive breast cancer, diagnosed in December 2018, with no evidence of disease.  She was treated with surgery, partial chemotherapy, adjuvant radiation and now hormonal therapy.  She was placed on anastrozole 1 mg daily in July 2019, and will continue this for at least 5 years.  I did discuss that she should consider taking it longer since her EP clin score was high risk, as long as she does not have unacceptable toxicities.   2. Osteopenia, she receives Reclast every other year which was scheduled in March of 2023. She also continues oral calcium and vitamin D. She will be due for repeat bone density in March 2025.   3. Barrett's esophagus, followed by Dr. Lyda Jester.  4.  Hyponatremia. I advised that she increase her oral sodium intake.   Plan: She knows to continue oral calcium and vitamin D, as well as anastrozole daily. Otherwise, we will see her back in 6 months for repeat evaluation. We will then  decide whether to stop the aromatase inhibitor. She will need to have her next DEXA in March of 2025 and will be due for her next mammogram in December of 2024. She verbalizes understanding of and agreement to the plans discussed today. She knows to call the office should any new questions or concerns arise.   I provided 20 minutes of face-to-face time during this this encounter and > 50% was spent counseling as documented under my assessment and plan.    Derwood Kaplan, MD [facility] AT [facility] [address] [place] [address] Dept: [phone] Dept Fax: [phone]   I, Rita Ohara, am acting as scribe for Derwood Kaplan, MD  I have reviewed this report as typed by the medical scribe, and it is complete and accurate.

## 2022-06-29 ENCOUNTER — Encounter: Payer: Self-pay | Admitting: Oncology

## 2022-09-12 ENCOUNTER — Telehealth: Payer: Self-pay | Admitting: Oncology

## 2022-10-11 ENCOUNTER — Ambulatory Visit: Payer: Medicare Other | Attending: Cardiology | Admitting: Cardiology

## 2022-10-11 ENCOUNTER — Encounter: Payer: Self-pay | Admitting: Cardiology

## 2022-10-11 VITALS — BP 180/80 | HR 60 | Ht 63.6 in | Wt 160.4 lb

## 2022-10-11 DIAGNOSIS — I4729 Other ventricular tachycardia: Secondary | ICD-10-CM

## 2022-10-11 DIAGNOSIS — E039 Hypothyroidism, unspecified: Secondary | ICD-10-CM

## 2022-10-11 DIAGNOSIS — E559 Vitamin D deficiency, unspecified: Secondary | ICD-10-CM

## 2022-10-11 DIAGNOSIS — I700 Atherosclerosis of aorta: Secondary | ICD-10-CM

## 2022-10-11 DIAGNOSIS — I10 Essential (primary) hypertension: Secondary | ICD-10-CM | POA: Diagnosis not present

## 2022-10-11 DIAGNOSIS — R7303 Prediabetes: Secondary | ICD-10-CM

## 2022-10-11 NOTE — Progress Notes (Signed)
Cardiology Office Note:    Date:  10/11/2022   ID:  Claire Carlson, DOB [birth year], MRN [id number]  PCP:  Gordan Payment., MD  Cardiologist:  Garwin Brothers, MD   Referring MD: Gordan Payment., MD    ASSESSMENT:    1. Nonsustained ventricular tachycardia (HCC)   2. Aortic atherosclerosis (HCC)   3. Essential hypertension    PLAN:    In order of problems listed above:  Primary prevention stressed with the patient.  Importance of compliance with diet medication stressed and patient verbalized standing.  Patient was advised to ambulate to the best of her ability and she promises to do so. Her to get sclerosis: Stable at this time.  Not on statin therapy.  She is not keen on it.  Lipids reviewed diet emphasized. Nonsustained ventricular tachycardia: Stable and asymptomatic.  She has not had any palpitations or any such issues. Essential hypertension: Blood pressure is elevated and diet was emphasized.  Lifestyle modification and relaxation techniques discussed.  She will send [place] blood pressures from home in the next couple of days.  She tells me that they are fine and mentioned to me the numbers.  She has an element of whitecoat hypertension. She will be seen in follow-up appointment on a as needed basis as she is moving to [place].  Will do complete blood work today so that she has some time getting settled in [place] before she finds a primary doctor.  She was advised to get established with primary care as soon as possible when she moves there.   Medication Adjustments/Labs and Tests Ordered: Current medicines are reviewed at length with the patient today.  Concerns regarding medicines are outlined above.  Orders Placed This Encounter  Procedures   EKG 12-Lead   No orders of the defined types were placed in this encounter.    No chief complaint on file.    History of Present Illness:    Claire Carlson is a 82 y.o. female.  Patient has past medical history of aortic  atherosclerosis, nonsustained ventricular tachycardia and essential hypertension.  She denies any problems at this time and takes care of activities of daily living.  No chest pain orthopnea or PND.  No palpitations.  She is moving her home to [place].  Past Medical History:  Diagnosis Date   Anxiety    Anxiety disorder 07/14/2015   Aortic atherosclerosis (HCC) 11/03/2019   Formatting of this note might be different from the original. seen on x-rays at [facility] 09/2019   Barrett's esophagus    Breast mass 02/07/2017   Cancer (HCC) 02/2017   right breast cancer   Complication of anesthesia    Degenerative lumbar disc 11/03/2019   Formatting of this note might be different from the original. xrays at [facility] 09/2019   Dysrhythmia    PVC's, PAC's   Essential hypertension 06/15/2015   Facial weakness 09/08/2020   GERD without esophagitis 06/15/2015   Hammertoes of both feet 07/31/2018   High risk medication use 06/15/2015   Hypercalcemia 03/16/2020   Hyperlipidemia 06/15/2015   Hypertension    Hypothyroidism (acquired) 07/14/2015   Invasive ductal carcinoma of breast, female, right (HCC) 09/08/2020   Loss of hearing 06/15/2015   Last Assessment & Plan:  Relevant Hx: Course: Daily Update: Today's Plan:with some removal of dry wax and skin and recommendation to use some sweet oil for her ears and softening them up more, she was as well given some flonase to use for her eustachian  tube   Electronically signed by: Krystal Clark, NP 06/15/15 2201  Last Assessment & Plan:  Formatting of this note might be different   Malignant neoplasm of central portion of left breast in female, estrogen receptor negative (HCC) 02/07/2017   Mixed hyperlipidemia 06/15/2015   Myalgia due to statin 03/16/2021   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 Uses reclast.   Other specified disorders of bone density and structure, multiple sites 06/01/2021   Pain of left foot 07/31/2018   Pedal edema 10/15/2017   Polyarthralgia 02/05/2020   PONV (postoperative nausea and vomiting)    Post-menopausal 05/03/2017   Pre-ulcerative calluses 07/31/2018   Prediabetes 06/15/2015   PVC (premature ventricular contraction)    Stage 3a chronic kidney disease (HCC) 06/15/2015   Vertigo    Vitamin D deficiency 06/15/2015    Past Surgical History:  Procedure Laterality Date   BREAST LUMPECTOMY WITH RADIOACTIVE SEED AND SENTINEL LYMPH NODE BIOPSY Right 04/06/2017   Procedure: RIGHT BREAST LUMPECTOMY WITH RADIOACTIVE SEED X 2 AND SENTINEL LYMPH NODE BIOPSY;  Surgeon: Glenna Fellows, MD;  Location: [facility];  Service: General;  Laterality: Right;   CESAREAN SECTION     CHOLECYSTECTOMY     EYE SURGERY     cataracts   MOUTH SURGERY     TONSILLECTOMY      Current Medications: No outpatient medications have been marked as taking for the 10/11/22 encounter (Office Visit) with Angle Dirusso, Aundra Dubin, MD.     Allergies:   Other, Penicillin g, Rosuvastatin, Statins, Tape, and Biaxin [clarithromycin]   Social History   Socioeconomic History   Marital status: Married    Spouse name: Not on file   Number of children: Not on file   Years of education: Not on file   Highest education level: Not on file  Occupational History   Not on file  Tobacco Use   Smoking status: Former    Current packs/day: 0.00    Types: Cigarettes    Quit date: 28    Years since quitting: 39.5   Smokeless tobacco: Never  Vaping Use   Vaping status: Never Used  Substance and Sexual Activity   Alcohol use: No   Drug use: No   Sexual activity: Not on file  Other Topics Concern   Not on file  Social History Narrative   Not on file   Social Determinants of Health   Financial Resource Strain: Not on file  Food Insecurity: Low Risk  (08/03/2022)    [facility]   Food vital sign    Within the past 12 months, you worried that your food would run out before you got money to buy more: Never true    Within the past 12 months, the food you bought just didn't last and you didn't have money to get more. : Never true  Transportation Needs: No Transportation Needs (08/03/2022)   [facility]   Transportation    In the past 12 months, has lack of reliable transportation kept you from medical appointments, meetings, work or from getting things needed for daily living? : No  Physical Activity: Not on file  Stress: Not on file  Social Connections: Not on file     Family History: The patient's family history includes Heart attack in her  father.  ROS:   Please see the history of present illness.    All other systems reviewed and are negative.  EKGs/Labs/Other Studies Reviewed:    The following studies were reviewed today: EKG reveals sinus rhythm and nonspecific ST-T changes   Recent Labs: 02/28/2022: BUN 22; Creatinine, Ser 1.00; Potassium 4.6; Sodium 138  Recent Lipid Panel    Component Value Date/Time   CHOL 186 03/01/2021 0941   TRIG 135 03/01/2021 0941   HDL 49 03/01/2021 0941   CHOLHDL 3.8 03/01/2021 0941   CHOLHDL 4.2 09/09/2020 0203   VLDL 38 09/09/2020 0203   LDLCALC 113 (H) 03/01/2021 0941    Physical Exam:    VS:  BP (!) 180/80   Pulse 60   Ht 5' 3.6" (1.615 m)   Wt 160 lb 6.4 oz (72.8 kg)   SpO2 97%   BMI 27.88 kg/m     Wt Readings from Last 3 Encounters:  10/11/22 160 lb 6.4 oz (72.8 kg)  06/02/22 168 lb 3.2 oz (76.3 kg)  02/28/22 168 lb (76.2 kg)     GEN: Patient is in no acute distress HEENT: Normal NECK: No JVD; No carotid bruits LYMPHATICS: No lymphadenopathy CARDIAC: Hear sounds regular, 2/6 systolic murmur at the apex. RESPIRATORY:  Clear to auscultation without rales, wheezing or rhonchi  ABDOMEN: Soft, non-tender,  non-distended MUSCULOSKELETAL:  No edema; No deformity  SKIN: Warm and dry NEUROLOGIC:  Alert and oriented x 3 PSYCHIATRIC:  Normal affect   Signed, Garwin Brothers, MD  10/11/2022 3:24 PM    [facility]

## 2022-10-11 NOTE — Patient Instructions (Signed)
Medication Instructions:  Your physician recommends that you continue on your current medications as directed. Please refer to the Current Medication list given to you today.  *If you need a refill on your cardiac medications before your next appointment, please call your pharmacy*   Lab Work: Your physician recommends that you have a CMP, CBC, TSH, Lipids, A1C and vitamin D.  If you have labs (blood work) drawn today and your tests are completely normal, you will receive your results only by: MyChart Message (if you have MyChart) OR A paper copy in the mail If you have any lab test that is abnormal or we need to change your treatment, we will call you to review the results.   Testing/Procedures: None ordered  Follow-Up: At [facility], you and your health needs are our priority.  As part of our continuing mission to provide you with exceptional heart care, we have created designated Provider Care Teams.  These Care Teams include your primary Cardiologist (physician) and Advanced Practice Providers (APPs -  Physician Assistants and Nurse Practitioners) who all work together to provide you with the care you need, when you need it.  We recommend signing up for the patient portal called "MyChart".  Sign up information is provided on this After Visit Summary.  MyChart is used to connect with patients for Virtual Visits (Telemedicine).  Patients are able to view lab/test results, encounter notes, upcoming appointments, etc.  Non-urgent messages can be sent to your provider as well.   To learn more about what you can do with MyChart, go to [email or web address].    Your next appointment:   As needed  The format for your next appointment:   In Person  Provider:   Belva Crome, MD    Other Instructions none  Important Information About Sugar

## 2022-10-12 LAB — COMPREHENSIVE METABOLIC PANEL
ALT: 19 IU/L (ref 0–32)
AST: 28 IU/L (ref 0–40)
Albumin: 4.4 g/dL (ref 3.7–4.7)
Alkaline Phosphatase: 63 IU/L (ref 44–121)
BUN/Creatinine Ratio: 12 (ref 12–28)
BUN: 12 mg/dL (ref 8–27)
Bilirubin Total: 0.3 mg/dL (ref 0.0–1.2)
CO2: 25 mmol/L (ref 20–29)
Calcium: 9.8 mg/dL (ref 8.7–10.3)
Chloride: 96 mmol/L (ref 96–106)
Creatinine, Ser: 0.97 mg/dL (ref 0.57–1.00)
Globulin, Total: 2.3 g/dL (ref 1.5–4.5)
Glucose: 104 mg/dL — ABNORMAL HIGH (ref 70–99)
Potassium: 4.1 mmol/L (ref 3.5–5.2)
Sodium: 136 mmol/L (ref 134–144)
Total Protein: 6.7 g/dL (ref 6.0–8.5)
eGFR: 58 mL/min/{1.73_m2} — ABNORMAL LOW (ref >59)

## 2022-10-12 LAB — VITAMIN D 25 HYDROXY (VIT D DEFICIENCY, FRACTURES): Vit D, 25-Hydroxy: 38.4 ng/mL (ref 30.0–100.0)

## 2022-10-12 LAB — CBC
Hematocrit: 38.8 % (ref 34.0–46.6)
Hemoglobin: 13.1 g/dL (ref 11.1–15.9)
MCH: 28.7 pg (ref 26.6–33.0)
MCHC: 33.8 g/dL (ref 31.5–35.7)
MCV: 85 fL (ref 79–97)
Platelets: 307 10*3/uL (ref 150–450)
RBC: 4.57 x10E6/uL (ref 3.77–5.28)
RDW: 13.1 % (ref 11.7–15.4)
WBC: 6.8 10*3/uL (ref 3.4–10.8)

## 2022-10-12 LAB — LIPID PANEL
Chol/HDL Ratio: 3.5 ratio (ref 0.0–4.4)
Cholesterol, Total: 182 mg/dL (ref 100–199)
HDL: 52 mg/dL (ref >39)
LDL Chol Calc (NIH): 84 mg/dL (ref 0–99)
Triglycerides: 279 mg/dL — ABNORMAL HIGH (ref 0–149)
VLDL Cholesterol Cal: 46 mg/dL — ABNORMAL HIGH (ref 5–40)

## 2022-10-12 LAB — HEMOGLOBIN A1C
Est. average glucose Bld gHb Est-mCnc: 117 mg/dL
Hgb A1c MFr Bld: 5.7 % — ABNORMAL HIGH (ref 4.8–5.6)

## 2022-10-12 LAB — TSH: TSH: 0.524 u[IU]/mL (ref 0.450–4.500)

## 2022-10-25 ENCOUNTER — Encounter: Payer: Self-pay | Admitting: Oncology

## 2022-10-25 NOTE — Progress Notes (Addendum)
[facility]  [address] [place],  [place]  [address] [phone]  Clinic Day:  10/31/22  Referring physician: Gordan Payment., MD   CHIEF COMPLAINT:  CC: Stage IA hormone receptor positive right breast cancer   Current Treatment:  Surveillance   HISTORY OF PRESENT ILLNESS:  Claire Carlson is a 82 y.o. female with a history of stage IA (T1c N0 M0) hormone receptor positive right breast cancer diagnosed in December 2018.  She was treated with lumpectomy in January 2019.  Pathology revealed a 1.7 cm, grade 2-3, invasive ductal carcinoma , as well as high-grade ductal carcinoma in situ with areas of close margin and lymphovascular invasion.  Estrogen and progesterone receptors were positive and her 2 Neu negative.  Ki 67 was 15%.  Three lymph nodes were negative for metastasis.  EndoPredict revealed a score of 4, which is in the high risk category, and corresponds to an 18% risk of recurrence when treated with endocrine therapy alone.  We recommended adjuvant chemotherapy with docetaxel/cyclophosphamide for 4 cycles, which she started in February.  Due to excessive toxicities, she discontinued chemotherapy after her 2nd cycle.  She had a history of osteopenia and had been receiving Reclast every 2 years prior to coming in for her breast cancer treatment.  Bone density scan in February 2019 revealed osteopenia with a T-score of -2 in the femur, which was stable when compared to 2016.  We recommended yearly Reclast due to the risk of worsening bone density associated with anastrozole.  She received Reclast in April when she recovered from chemotherapy.  She received adjuvant radiation to the right breast completed in in May.  When she was seen in June for routine follow-up, she was placed on adjuvant hormonal therapy with anastrozole 1 mg daily, which she started July 1st 2019.  Bilateral diagnostic mammogram in November 2020 did not reveal any evidence of  malignancy.  We reviewed her family history and her sister has had stage I endometrial cancer at age 63, a first cousin had pancreatic cancer at age 67, her mother had lung cancer at age 44 and her maternal grandfather had liver cancer.  Her cousin Jonny Ruiz also had malignancy of unknown type and unknown age. Her sister that had the endometrial cancer has now been diagnosed with cancer of the tongue and neck which is positive for HPV. Bone density scan from March 2021 showed stable osteopenia with a T-score of -2.0 of the left femur.  Dual femur total mean measures -1.1, previously -1.0.  The AP spine is within the normal limits with a T-score of -0.5, previously -0.4.  Mammogram in December 2021 was negative for any findings concerning for disease.  There is a seroma cavity at 11 o'clock in the right breast which measures 2.3 cm and a well healed lumpectomy scar at the areolar margin in the upper inner quadrant of the right breast.  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 She has finished her 5 year dose of Anastrozole on 09/25/2022. She has a bug bite on her feet which is  severely pruritic. Her next mammograph would be due in December, 2024. She says she is moving to [place] next week. She was due for IV Reclast back in June 2022, but this was not administered until March of 2023. She prefers to have it every other year.  She will be due for a bone density in March 2025, and then could decide whether to continue Reclast. She  denies signs of infections such as sore throat, sinus drainage, cough or urinary symptoms. She  denies fever or recurrent chills. She  also deny nausea, vomiting, chest pain, or dyspnea. Her  appetite is good and Her  weight has increased 4 pounds over last 5 months     REVIEW OF SYSTEMS:  Review of Systems  Constitutional: Negative.  Negative for  appetite change, chills, diaphoresis, fatigue, fever and unexpected weight change.  HENT:  Negative.  Negative for hearing loss, lump/mass, mouth sores, nosebleeds, sore throat, tinnitus, trouble swallowing and voice change.   Eyes: Negative.  Negative for eye problems and icterus.  Respiratory: Negative.  Negative for chest tightness, cough, hemoptysis, shortness of breath and wheezing.   Cardiovascular: Negative.  Negative for chest pain, leg swelling and palpitations.  Gastrointestinal: Negative.  Negative for abdominal distention, abdominal pain, blood in stool, constipation, diarrhea, nausea, rectal pain and vomiting.  Endocrine: Negative.   Genitourinary: Negative.  Negative for bladder incontinence, difficulty urinating, dyspareunia, dysuria, frequency, hematuria, menstrual problem, nocturia, pelvic pain, vaginal bleeding and vaginal discharge.   Musculoskeletal: Negative.  Negative for arthralgias, back pain, flank pain, gait problem, myalgias, neck pain and neck stiffness.  Skin: Negative.  Negative for itching, rash and wound.  Neurological: Negative.  Negative for dizziness, extremity weakness, gait problem, headaches, light-headedness, numbness, seizures and speech difficulty.  Hematological: Negative.  Negative for adenopathy. Does not bruise/bleed easily.  Psychiatric/Behavioral: Negative.  Negative for confusion, decreased concentration, depression, sleep disturbance and suicidal ideas. The patient is not nervous/anxious.      VITALS:  Blood pressure 135/70, pulse (!) 59, temperature 98 F (36.7 C), temperature source Oral, resp. rate 18, height 5' 3.6" (1.615 m), weight 164 lb 11.2 oz (74.7 kg), SpO2 99%.  Wt Readings from Last 3 Encounters:  10/31/22 164 lb 11.2 oz (74.7 kg)  10/11/22 160 lb 6.4 oz (72.8 kg)  06/02/22 168 lb 3.2 oz (76.3 kg)    Body mass index is 28.63 kg/m.  Performance status (ECOG): 0 - Asymptomatic  PHYSICAL EXAM:  Physical Exam Constitutional:       General: She is not in acute distress.    Appearance: Normal appearance. She is normal weight.  HENT:     Head: Normocephalic and atraumatic.  Eyes:     General: No scleral icterus.    Extraocular Movements: Extraocular movements intact.     Conjunctiva/sclera: Conjunctivae normal.     Pupils: Pupils are equal, round, and reactive to light.  Cardiovascular:     Rate and Rhythm: Regular rhythm. Bradycardia present.     Pulses: Normal pulses.     Heart sounds: Normal heart sounds. No murmur heard.    No friction rub. No gallop.  Pulmonary:     Effort: Pulmonary effort is normal. No respiratory distress.     Breath sounds: Normal breath sounds.  Chest:     Comments: She has a well head scar in the right upper quadrant of the right breast adjacent to the nipple areolar complex and a well healed scar in the right axilla No masses in either breast    Abdominal:     General: Bowel sounds are normal. There is no distension.     Palpations: Abdomen is soft. There is no hepatomegaly, splenomegaly or mass.     Tenderness: There is no abdominal tenderness.  Musculoskeletal:  General: Normal range of motion.     Cervical back: Normal range of motion and neck supple.     Right lower leg: No edema.     Left lower leg: No edema.  Lymphadenopathy:     Cervical: No cervical adenopathy.  Skin:    General: Skin is warm and dry.  Neurological:     General: No focal deficit present.     Mental Status: She is alert and oriented to person, place, and time. Mental status is at baseline.  Psychiatric:        Mood and Affect: Mood normal.        Behavior: Behavior normal.        Thought Content: Thought content normal.        Judgment: Judgment normal.    LABS:      Latest Ref Rng & Units 10/11/2022    3:41 PM 06/01/2021   12:00 AM 11/01/2020   12:00 AM  CBC  WBC 3.4 - 10.8 x10E3/uL 6.8  6.8  6.0      Hemoglobin 11.1 - 15.9 g/dL 16.1  09.6  04.5      Hematocrit 34.0 - 46.6 % 38.8  40   41      Platelets 150 - 450 x10E3/uL 307  241  273         This result is from an external source.      Latest Ref Rng & Units 10/11/2022    3:41 PM 02/28/2022    1:29 PM 06/16/2021   10:33 AM  CMP  Glucose 70 - 99 mg/dL 409  87  70   BUN 8 - 27 mg/dL 12  22  18    Creatinine 0.57 - 1.00 mg/dL 8.11  9.14  7.82   Sodium 134 - 144 mmol/L 136  138  136   Potassium 3.5 - 5.2 mmol/L 4.1  4.6  5.1   Chloride 96 - 106 mmol/L 96  99  99   CO2 20 - 29 mmol/L 25  25  26    Calcium 8.7 - 10.3 mg/dL 9.8  95.6  9.5   Total Protein 6.0 - 8.5 g/dL 6.7     Total Bilirubin 0.0 - 1.2 mg/dL 0.3     Alkaline Phos 44 - 121 IU/L 63     AST 0 - 40 IU/L 28     ALT 0 - 32 IU/L 19      Component Ref Range & Units 2 wk ago  Vit D, 25-Hydroxy 30.0 - 100.0 ng/mL 38.4    STUDIES:   EXAM: 03/01/22 MAMMOGRAM BILATERAL TOMO SCREENING  IMPRESSION: No mammographic evidence of malignancy. Recommend repeat screening mammogram in 1 year.    EXAM: 05/27/2021 DUAL X-RAY ABSORPTIOMETRY (DXA) FOR BONE MINERAL DENSITY   IMPRESSION:  Amarionna Narvaiz completed a BMD test on 05/27/2021 using the Lunar  iDXA DXA System (analysis version: 13.60) manufactured by [employer]. The following summarizes the results of our evaluation.  Technologist: RMG   PATIENT BIOGRAPHICAL:  Name: VELISSA, REVELLE  Patient ID: [id number] [facility] Birth Date: 1940-05-18 Height: 64.5 in.  Gender: Female Exam Date: 05/27/2021 Weight: 162.6 lbs.  Indications: M85.89 Fractures: Treatments:   ASSESSMENT:  The BMD measured at Femur Neck Left is 0.733 g/cm2 with a T-score of  -2.2. This patient is considered osteopenic according to World  Health Organization [facility]) criteria.  The scan quality is good.  Patient does not meet criteria for FRAX assessment. Patient  currently takes a bone  building therapy.  Site Region Measured Measured WHO Young Adult BMD  Date Age Classification T-score   AP Spine L1-L4 05/27/2021 82.0 Normal -0.4 1.128  g/cm2  AP Spine L1-L4 05/26/2019 80.0 Normal -0.5 1.122 g/cm2  AP Spine L1-L4 05/21/2017 78.0 Normal -0.4 1.130 g/cm2  AP Spine L1-L4 01/22/2015 75.6 Normal -0.5 1.124 g/cm2   DualFemur Neck Left 05/27/2021 82.0 Osteopenia -2.2 0.733 g/cm2  DualFemur Neck Left 05/26/2019 80.0 Osteopenia -2.0 0.760 g/cm2  DualFemur Neck Left 05/21/2017 78.0 Osteopenia -2.0 0.762 g/cm2  DualFemur Neck Left 01/22/2015 75.6 Osteopenia -2.0 0.765 g/cm2   DualFemur Total Mean 05/27/2021 82.0 Osteopenia -1.4 0.836 g/cm2  DualFemur Total Mean 05/26/2019 80.0 Osteopenia -1.1 0.864 g/cm2  DualFemur Total Mean 05/21/2017 78.0 Normal -1.0 0.881 g/cm2  DualFemur Total Mean 01/22/2015 75.6 Normal -0.9 0.895 g/cm2   Allergies:  Allergies  Allergen Reactions   Other Other (See Comments)    Some tapes break out the skin- Paper tape is tolerated   Penicillin G Other (See Comments)    Welts  welts   Rosuvastatin Other (See Comments)    Cramping of the legs   Statins Other (See Comments)    Cramping of the legs   Tape     Some tapes break out the skin- Paper tape is tolerated   Biaxin [Clarithromycin] Rash    Current Medications: Current Outpatient Medications  Medication Sig Dispense Refill   ALPRAZolam (XANAX) 0.25 MG tablet Take 0.5-1 tablets by mouth every 8 (eight) hours as needed for anxiety.     amLODipine (NORVASC) 2.5 MG tablet Take 1 tablet (2.5 mg total) by mouth daily. 90 tablet 3   atenolol (TENORMIN) 25 MG tablet Take 0.5 tablets (12.5 mg total) by mouth daily. 90 tablet 3   BIOTIN 5000 PO Take 5,000 mg by mouth daily.     Calcium Carbonate-Vitamin D (CALTRATE 600+D PO) Take 1 tablet by mouth daily.     Cholecalciferol (VITAMIN D3) 50 MCG (2000 UT) TABS Take 2,000 Units by mouth in the morning.     ezetimibe (ZETIA) 10 MG tablet Take 1 tablet (10 mg total) by mouth daily. 90 tablet 3   LECITHIN PO Take 1 tablet by mouth daily.     levothyroxine (SYNTHROID, LEVOTHROID) 88 MCG tablet Take 88 mcg by mouth  daily.     loratadine (CLARITIN) 10 MG tablet Take 10 mg by mouth daily as needed for allergies or rhinitis.     losartan-hydrochlorothiazide (HYZAAR) 100-12.5 MG tablet Take 1 tablet by mouth daily. 90 tablet 3   Magnesium 250 MG TABS Take 250 mg by mouth daily with supper.     Meclizine HCl 25 MG CHEW Chew 25 mg by mouth as needed for dizziness.     Multiple Vitamins-Calcium (ONE-A-DAY WOMENS FORMULA PO) Take 1 tablet by mouth daily with breakfast.     omeprazole (PRILOSEC) 20 MG capsule Take 20 mg by mouth daily before breakfast.     vitamin C (ASCORBIC ACID) 500 MG tablet Take 500 mg by mouth daily.     vitamin E 200 UNIT capsule Take 400 Units by mouth daily.     No current facility-administered medications for this visit.     ASSESSMENT & PLAN:   Assessment:   1. Stage IA hormone receptor positive breast cancer, diagnosed in December 2018, with no evidence of disease.  She was treated with surgery, partial chemotherapy, adjuvant radiation and now hormonal therapy.  She was placed on anastrozole 1 mg daily in July 2019,  and completed 5 years.  I did discuss that she could consider taking it longer since her EPclin score was high risk, as long as she does not have unacceptable toxicities. She preferred to stop it.  2. Osteopenia, she receives Reclast every other year which was scheduled in March of 2023. She also continues oral calcium and vitamin D. She will be due for repeat bone density in March 2025, and then can decide whether to continue the Reclast.   3. Barrett's esophagus, followed by Dr. Jennye Boroughs.  4.  Hyponatremia. I advised that she increase her oral sodium intake.   Plan She has finished her 5 years of Anastrozole on 09/25/2022. She has a bug bite on her feet which is severely pruritic.   Her next mammogram would be in December, 2024. She says she is moving to [place] next week. She was due for IV Reclast back in June 2022, but this was not administered until March of  2023. She prefers to have it every other year. She will be due for a bone density in March 2025, and then can decide whether to continue the Reclast.  She verbalizes understanding of and agreement to the plans discussed today. She knows to call the office should any new questions or concerns arise.   I provided 20 minutes of face-to-face time during this this encounter and > 50% was spent counseling as documented under my assessment and plan.    Dellia Beckwith, MD [facility] [address] [place] [address] Dept: [phone] Dept Fax: [phone]    I,Oluwatobi Asade,acting as a scribe for Dellia Beckwith, MD.,have documented all relevant documentation on the behalf of Dellia Beckwith, MD,as directed by  Dellia Beckwith, MD while in the presence of Dellia Beckwith, MD.   I have reviewed this report as typed by the medical scribe, and it is complete and accurate.

## 2022-10-25 NOTE — Telephone Encounter (Signed)
Created in error

## 2022-10-31 ENCOUNTER — Inpatient Hospital Stay: Payer: Medicare Other | Attending: Oncology | Admitting: Oncology

## 2022-10-31 ENCOUNTER — Encounter: Payer: Self-pay | Admitting: Oncology

## 2022-10-31 VITALS — BP 135/70 | HR 59 | Temp 98.0°F | Resp 18 | Ht 63.6 in | Wt 164.7 lb

## 2022-10-31 DIAGNOSIS — Z171 Estrogen receptor negative status [ER-]: Secondary | ICD-10-CM

## 2022-10-31 DIAGNOSIS — C50112 Malignant neoplasm of central portion of left female breast: Secondary | ICD-10-CM

## 2022-10-31 DIAGNOSIS — M8580 Other specified disorders of bone density and structure, unspecified site: Secondary | ICD-10-CM

## 2022-11-09 ENCOUNTER — Other Ambulatory Visit: Payer: Self-pay

## 2022-11-09 MED ORDER — LOSARTAN POTASSIUM-HCTZ 100-12.5 MG PO TABS: 1.0000 | ORAL_TABLET | Freq: Every day | ORAL | 3 refills | Status: AC

## 2022-11-09 MED ORDER — EZETIMIBE 10 MG PO TABS: 10.0000 mg | ORAL_TABLET | Freq: Every day | ORAL | 3 refills | Status: DC

## 2022-11-09 MED ORDER — AMLODIPINE BESYLATE 2.5 MG PO TABS: 2.5000 mg | ORAL_TABLET | Freq: Every day | ORAL | 3 refills | Status: AC

## 2022-11-09 MED ORDER — ATENOLOL 25 MG PO TABS: 12.5000 mg | ORAL_TABLET | Freq: Every day | ORAL | 3 refills | Status: AC

## 2022-11-12 ENCOUNTER — Encounter: Payer: Self-pay | Admitting: Oncology

## 2022-11-28 ENCOUNTER — Ambulatory Visit: Payer: Medicare Other | Admitting: Cardiology

## 2022-12-06 ENCOUNTER — Ambulatory Visit: Payer: Medicare Other | Admitting: Oncology

## 2023-03-29 ENCOUNTER — Other Ambulatory Visit: Payer: Self-pay | Admitting: Cardiology

## 2023-05-15 ENCOUNTER — Other Ambulatory Visit: Payer: Self-pay | Admitting: Oncology

## 2023-05-15 ENCOUNTER — Other Ambulatory Visit: Payer: Self-pay | Admitting: Cardiology

## 2023-05-15 DIAGNOSIS — C50112 Malignant neoplasm of central portion of left female breast: Secondary | ICD-10-CM

## 2023-09-21 ENCOUNTER — Other Ambulatory Visit: Payer: Self-pay | Admitting: Cardiology
